# Patient Record
Sex: Female | Born: 1975 | Race: White | Hispanic: No | Marital: Married | Smoking: Current every day smoker
Health system: Southern US, Community
[De-identification: ages and names within clinical notes are randomized; demographics above are authoritative.]

## PROBLEM LIST (undated history)

## (undated) DIAGNOSIS — I495 Sick sinus syndrome: Secondary | ICD-10-CM

## (undated) DIAGNOSIS — F329 Major depressive disorder, single episode, unspecified: Secondary | ICD-10-CM

## (undated) DIAGNOSIS — T7840XA Allergy, unspecified, initial encounter: Secondary | ICD-10-CM

## (undated) DIAGNOSIS — I10 Essential (primary) hypertension: Secondary | ICD-10-CM

## (undated) DIAGNOSIS — F419 Anxiety disorder, unspecified: Secondary | ICD-10-CM

## (undated) DIAGNOSIS — G43909 Migraine, unspecified, not intractable, without status migrainosus: Secondary | ICD-10-CM

## (undated) DIAGNOSIS — A879 Viral meningitis, unspecified: Secondary | ICD-10-CM

## (undated) DIAGNOSIS — F32A Depression, unspecified: Secondary | ICD-10-CM

## (undated) HISTORY — DX: Viral meningitis, unspecified: A87.9

## (undated) HISTORY — DX: Major depressive disorder, single episode, unspecified: F32.9

## (undated) HISTORY — DX: Anxiety disorder, unspecified: F41.9

## (undated) HISTORY — DX: Migraine, unspecified, not intractable, without status migrainosus: G43.909

## (undated) HISTORY — DX: Depression, unspecified: F32.A

## (undated) HISTORY — DX: Allergy, unspecified, initial encounter: T78.40XA

---

## 2002-01-16 DIAGNOSIS — A879 Viral meningitis, unspecified: Secondary | ICD-10-CM

## 2002-01-16 HISTORY — DX: Viral meningitis, unspecified: A87.9

## 2003-11-27 ENCOUNTER — Ambulatory Visit (HOSPITAL_COMMUNITY): Admission: RE | Admit: 2003-11-27 | Discharge: 2003-11-27 | Payer: Self-pay | Admitting: Specialist

## 2003-12-02 ENCOUNTER — Encounter: Admission: RE | Admit: 2003-12-02 | Discharge: 2003-12-02 | Payer: Self-pay | Admitting: Specialist

## 2005-03-13 ENCOUNTER — Emergency Department: Payer: Self-pay | Admitting: Emergency Medicine

## 2007-02-23 ENCOUNTER — Ambulatory Visit: Payer: Self-pay | Admitting: Family Medicine

## 2008-07-23 ENCOUNTER — Ambulatory Visit: Payer: Self-pay | Admitting: Family Medicine

## 2010-05-09 ENCOUNTER — Ambulatory Visit: Payer: Self-pay | Admitting: Family Medicine

## 2010-10-19 ENCOUNTER — Ambulatory Visit: Payer: Self-pay | Admitting: Obstetrics and Gynecology

## 2011-09-08 ENCOUNTER — Ambulatory Visit: Payer: Self-pay

## 2011-09-08 LAB — URINALYSIS, COMPLETE

## 2011-09-10 LAB — URINE CULTURE

## 2013-02-27 ENCOUNTER — Ambulatory Visit: Payer: Self-pay

## 2013-02-27 LAB — RAPID STREP-A WITH REFLX: Micro Text Report: NEGATIVE

## 2013-03-02 LAB — BETA STREP CULTURE(ARMC)

## 2014-05-20 ENCOUNTER — Ambulatory Visit
Admission: RE | Admit: 2014-05-20 | Discharge: 2014-05-20 | Disposition: A | Discharge status: Home / Self Care | Payer: BC Managed Care – PPO | Source: Ambulatory Visit | Attending: Family Medicine | Admitting: Family Medicine

## 2014-05-20 ENCOUNTER — Other Ambulatory Visit: Payer: Self-pay | Admitting: Family Medicine

## 2014-05-20 DIAGNOSIS — R102 Pelvic and perineal pain: Secondary | ICD-10-CM | POA: Diagnosis not present

## 2014-05-20 DIAGNOSIS — R188 Other ascites: Secondary | ICD-10-CM | POA: Diagnosis not present

## 2014-05-22 ENCOUNTER — Other Ambulatory Visit: Payer: Self-pay | Admitting: Family Medicine

## 2014-05-22 DIAGNOSIS — N950 Postmenopausal bleeding: Secondary | ICD-10-CM

## 2014-10-15 ENCOUNTER — Other Ambulatory Visit: Payer: Self-pay | Admitting: Obstetrics and Gynecology

## 2014-10-15 DIAGNOSIS — Z1231 Encounter for screening mammogram for malignant neoplasm of breast: Secondary | ICD-10-CM

## 2014-10-22 ENCOUNTER — Ambulatory Visit
Admission: RE | Admit: 2014-10-22 | Discharge: 2014-10-22 | Disposition: A | Discharge status: Home / Self Care | Payer: BC Managed Care – PPO | Source: Ambulatory Visit | Attending: Obstetrics and Gynecology | Admitting: Obstetrics and Gynecology

## 2014-10-22 DIAGNOSIS — Z1231 Encounter for screening mammogram for malignant neoplasm of breast: Secondary | ICD-10-CM | POA: Diagnosis not present

## 2015-10-19 ENCOUNTER — Other Ambulatory Visit: Payer: Self-pay | Admitting: Obstetrics and Gynecology

## 2015-10-19 DIAGNOSIS — Z1231 Encounter for screening mammogram for malignant neoplasm of breast: Secondary | ICD-10-CM

## 2015-10-26 ENCOUNTER — Ambulatory Visit
Admission: RE | Admit: 2015-10-26 | Discharge: 2015-10-26 | Disposition: A | Discharge status: Home / Self Care | Payer: Managed Care, Other (non HMO) | Source: Ambulatory Visit | Attending: Obstetrics and Gynecology | Admitting: Obstetrics and Gynecology

## 2015-10-26 ENCOUNTER — Encounter: Payer: Self-pay | Admitting: Radiology

## 2015-10-26 DIAGNOSIS — Z1231 Encounter for screening mammogram for malignant neoplasm of breast: Secondary | ICD-10-CM | POA: Diagnosis not present

## 2016-11-07 ENCOUNTER — Other Ambulatory Visit: Payer: Self-pay | Admitting: Obstetrics and Gynecology

## 2016-11-07 DIAGNOSIS — Z1231 Encounter for screening mammogram for malignant neoplasm of breast: Secondary | ICD-10-CM

## 2016-11-27 ENCOUNTER — Ambulatory Visit
Admission: RE | Admit: 2016-11-27 | Discharge: 2016-11-27 | Disposition: A | Discharge status: Home / Self Care | Payer: 59 | Source: Ambulatory Visit | Attending: Obstetrics and Gynecology | Admitting: Obstetrics and Gynecology

## 2016-11-27 DIAGNOSIS — Z1231 Encounter for screening mammogram for malignant neoplasm of breast: Secondary | ICD-10-CM | POA: Insufficient documentation

## 2017-01-17 ENCOUNTER — Ambulatory Visit
Admission: RE | Admit: 2017-01-17 | Discharge: 2017-01-17 | Disposition: A | Discharge status: Home / Self Care | Payer: 59 | Source: Ambulatory Visit | Attending: Hematology and Oncology | Admitting: Hematology and Oncology

## 2017-01-17 ENCOUNTER — Other Ambulatory Visit: Payer: Self-pay

## 2017-01-17 ENCOUNTER — Inpatient Hospital Stay: Payer: 59

## 2017-01-17 ENCOUNTER — Inpatient Hospital Stay: Payer: 59 | Attending: Hematology and Oncology | Admitting: Hematology and Oncology

## 2017-01-17 ENCOUNTER — Encounter: Payer: Self-pay | Admitting: Hematology and Oncology

## 2017-01-17 VITALS — BP 119/77 | HR 71 | Temp 97.8°F | Resp 18 | Ht 68.0 in | Wt 171.5 lb

## 2017-01-17 DIAGNOSIS — Z79899 Other long term (current) drug therapy: Secondary | ICD-10-CM | POA: Diagnosis not present

## 2017-01-17 DIAGNOSIS — R490 Dysphonia: Secondary | ICD-10-CM

## 2017-01-17 DIAGNOSIS — E039 Hypothyroidism, unspecified: Secondary | ICD-10-CM | POA: Diagnosis not present

## 2017-01-17 DIAGNOSIS — R5383 Other fatigue: Secondary | ICD-10-CM | POA: Diagnosis not present

## 2017-01-17 DIAGNOSIS — Z803 Family history of malignant neoplasm of breast: Secondary | ICD-10-CM

## 2017-01-17 DIAGNOSIS — M549 Dorsalgia, unspecified: Secondary | ICD-10-CM

## 2017-01-17 DIAGNOSIS — M25559 Pain in unspecified hip: Secondary | ICD-10-CM

## 2017-01-17 DIAGNOSIS — R0982 Postnasal drip: Secondary | ICD-10-CM

## 2017-01-17 DIAGNOSIS — D72828 Other elevated white blood cell count: Secondary | ICD-10-CM | POA: Insufficient documentation

## 2017-01-17 DIAGNOSIS — F418 Other specified anxiety disorders: Secondary | ICD-10-CM | POA: Diagnosis not present

## 2017-01-17 DIAGNOSIS — R51 Headache: Secondary | ICD-10-CM

## 2017-01-17 DIAGNOSIS — R7989 Other specified abnormal findings of blood chemistry: Secondary | ICD-10-CM

## 2017-01-17 DIAGNOSIS — M25511 Pain in right shoulder: Secondary | ICD-10-CM | POA: Diagnosis not present

## 2017-01-17 DIAGNOSIS — M25512 Pain in left shoulder: Secondary | ICD-10-CM | POA: Diagnosis not present

## 2017-01-17 DIAGNOSIS — M25562 Pain in left knee: Secondary | ICD-10-CM | POA: Diagnosis not present

## 2017-01-17 DIAGNOSIS — M25561 Pain in right knee: Secondary | ICD-10-CM

## 2017-01-17 DIAGNOSIS — D72829 Elevated white blood cell count, unspecified: Secondary | ICD-10-CM

## 2017-01-17 DIAGNOSIS — F17210 Nicotine dependence, cigarettes, uncomplicated: Secondary | ICD-10-CM | POA: Diagnosis not present

## 2017-01-17 DIAGNOSIS — R0683 Snoring: Secondary | ICD-10-CM | POA: Diagnosis not present

## 2017-01-17 LAB — CBC WITH DIFFERENTIAL/PLATELET
Basophils Absolute: 0.1 10*3/uL (ref 0–0.1)
Basophils Relative: 1 %
Eosinophils Absolute: 0.2 10*3/uL (ref 0–0.7)
Eosinophils Relative: 2 %
HCT: 42.1 % (ref 35.0–47.0)
Hemoglobin: 14.4 g/dL (ref 12.0–16.0)
Lymphocytes Relative: 24 %
Lymphs Abs: 2.8 10*3/uL (ref 1.0–3.6)
MCH: 30.3 pg (ref 26.0–34.0)
MCHC: 34.1 g/dL (ref 32.0–36.0)
MCV: 88.9 fL (ref 80.0–100.0)
Monocytes Absolute: 0.8 10*3/uL (ref 0.2–0.9)
Monocytes Relative: 7 %
Neutro Abs: 7.9 10*3/uL — ABNORMAL HIGH (ref 1.4–6.5)
Neutrophils Relative %: 66 %
Platelets: 315 10*3/uL (ref 150–440)
RBC: 4.74 MIL/uL (ref 3.80–5.20)
RDW: 13.6 % (ref 11.5–14.5)
WBC: 11.8 10*3/uL — ABNORMAL HIGH (ref 3.6–11.0)

## 2017-01-17 LAB — T4, FREE: Free T4: 0.53 ng/dL — ABNORMAL LOW (ref 0.61–1.12)

## 2017-01-17 NOTE — Progress Notes (Signed)
Patient here today regarding elevated WBC.  Referred by Dr. Merlinda FrederickMcLaughlin.  Patient states she gets 8 hours sleep a night and continues to be fatigued all the time.

## 2017-01-17 NOTE — Progress Notes (Signed)
[facility]-  Cancer Center  Clinic day:  01/17/2017  Chief Complaint: Connie Oneal is a 42 y.o. female with an elevated WBC who is referred in consultation by Dr. Maurine MinisterMiriam McLaughlin for assessment and management.  HPI: The patient first became aware of an elevated WBC after her regular GYN appointment in 10/2016.  She was feeling well.  She was told that her white blood cell count was mildly elevated.  CBCs are available since 11/12/2013.  CBC on 10/22/2015 was normal.  CBC on 11/07/2016 revealed a hematocrit of 40.5, hemoglobin 13.5, MCV 92.7, platelets 301,0000, WBC 12,200 with an ANC of 7660.  Differential included 63% segs, 28% lymphs, 7% monocytes, and 2% eosinophils.  CBC on 12/27/2016 revealed a hematocrit of 42.1, hemoglobin 14.0, MCV 92.5, platelets 354,000, WBC 15,700 with an ANC of 11,500.  Differential included 73% segs, 20% lymphs, 5% monocytes, and 1% eosinophils.  CMP was normal on 12/27/2016.  Sed rate was 8 on 12/27/2016.  RA latex turbid was negative on 12/28/2016.  B12 was 468 on 11/07/2016.  TSH was 5.744 (high) on 12/27/2016.  She has been extremely fatigued x 2 months.  She is sleeping well.  She denies sleep apnea but notes "I snore like I was cutting down a forest".    She notes a 2 month history of joint pain (shoulders).  She describes hip and knee discomfort and feeling "like the left leg bone pops out of place".  She describes her back bothering her (old injury 10 years ago).  She states that yesterday, she had difficulty lifting her left arm.  She notes a deep ache .  She denies any B symptoms.  She denies any cough or shortness of breath.  She denies any urinary symptoms.  She notes a history of blood in her urine in 12/2016.  She denies any bone pain.  She denies any skin infections or after bath itching.    She notes hoarseness and a deeper voice x 2 years.  She denies any post nasal drip or chronic congestion.  She notes that her hoarseness  worsens when she has post nasal drip.  She has smoked 1/2 pack per day x 25 years.   Past Medical History:  Diagnosis Date  . Allergy   . Anxiety   . Depression   . Migraines   . Viral meningitis 2004    No past surgical history on file.  Family History  Problem Relation Age of Onset  . Breast cancer Paternal Aunt [id number]    Social History:  reports that she has been smoking cigarettes.  She has a 12.50 pack-year smoking history. She does not have any smokeless tobacco history on file. She reports that she drinks alcohol. Her drug history is not on file.  She has smokeed 10 cigarettes a day x 25 years.  She drinks alcohol on the weekend.  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  The patient is alone today.  Allergies:  Allergies  Allergen Reactions  . Codeine Other (See Comments)    Other Reaction: Not Assessed    Current Medications: Current Outpatient Medications  Medication Sig Dispense Refill  . fexofenadine (ALLEGRA) 180 MG tablet Take 180 mg by mouth daily.    . hydrochlorothiazide (HYDRODIURIL) 25 MG tablet Take 25 mg by mouth daily.    Marland Kitchen. PARoxetine (PAXIL) 20 MG tablet Take 20 mg by mouth daily.     No current facility-administered medications for this visit.  Review of Systems:  GENERAL:  Fatigue x 2 months.  No fevers, sweats or weight loss. PERFORMANCE STATUS (ECOG):  0 HEENT:  Hoarse x 2 years.  No visual changes, runny nose, sore throat, mouth sores or tenderness. Lungs: No shortness of breath or cough.  No hemoptysis. Cardiac:  No chest pain, palpitations, orthopnea, or PND. GI:  No nausea, vomiting, diarrhea, constipation, melena or hematochezia. GU:  No urgency, frequency, dysuria.  Hematuria in 12/2016. Musculoskeletal:  Old back pain.  Shoulder pain.  Hip and knee pain.  Ache in left arm yesterday. Extremities:  No pain or swelling. Skin:  No rashes or skin changes. Neuro:  Intermittent headache.  No numbness or weakness, balance or coordination  issues. Endocrine:  No diabetes, thyroid issues, hot flashes or night sweats. Psych:  No mood changes, depression or anxiety. Pain:  No focal pain. Review of systems:  All other systems reviewed and found to be negative.  Physical Exam: Blood pressure 119/77, pulse 71, temperature 97.8 F (36.6 C), temperature source Tympanic, resp. rate 18, height 5\' 8"  (1.727 m), weight 171 lb 8.3 oz (77.8 kg). GENERAL:  Well developed, well nourished, woman sitting comfortably in the exam room in no acute distress. MENTAL STATUS:  Alert and oriented to person, place and time. HEAD:  Long blonde hair.  Normocephalic, atraumatic, face symmetric, no Cushingoid features. EYES:  Blue eyes.  Pupils equal round and reactive to light and accomodation.  No conjunctivitis or scleral icterus. ENT:  Hoarse.  Oropharynx clear without lesion.  Tongue normal. Mucous membranes moist.  RESPIRATORY:  Clear to auscultation without rales, wheezes or rhonchi. CARDIOVASCULAR:  Regular rate and rhythm without murmur, rub or gallop. ABDOMEN:  Soft, non-tender, with active bowel sounds, and no hepatosplenomegaly.  No masses. SKIN:  No rashes, ulcers or lesions. EXTREMITIES: No edema, no skin discoloration or tenderness.  No palpable cords. LYMPH NODES: No palpable cervical, supraclavicular, axillary or inguinal adenopathy  NEUROLOGICAL: Unremarkable. PSYCH:  Appropriate.   Office Visit on 01/17/2017  Component Date Value Ref Range Status  . WBC 01/17/2017 11.8* 3.6 - 11.0 K/uL Final  . RBC 01/17/2017 4.74  3.80 - 5.20 MIL/uL Final  . Hemoglobin 01/17/2017 14.4  12.0 - 16.0 g/dL Final  . HCT 16/10/9602 42.1  35.0 - 47.0 % Final  . MCV 01/17/2017 88.9  80.0 - 100.0 fL Final  . MCH 01/17/2017 30.3  26.0 - 34.0 pg Final  . MCHC 01/17/2017 34.1  32.0 - 36.0 g/dL Final  . RDW 54/09/8117 13.6  11.5 - 14.5 % Final  . Platelets 01/17/2017 315  150 - 440 K/uL Final  . Neutrophils Relative % 01/17/2017 66  % Final  . Neutro Abs  01/17/2017 7.9* 1.4 - 6.5 K/uL Final  . Lymphocytes Relative 01/17/2017 24  % Final  . Lymphs Abs 01/17/2017 2.8  1.0 - 3.6 K/uL Final  . Monocytes Relative 01/17/2017 7  % Final  . Monocytes Absolute 01/17/2017 0.8  0.2 - 0.9 K/uL Final  . Eosinophils Relative 01/17/2017 2  % Final  . Eosinophils Absolute 01/17/2017 0.2  0 - 0.7 K/uL Final  . Basophils Relative 01/17/2017 1  % Final  . Basophils Absolute 01/17/2017 0.1  0 - 0.1 K/uL Final   Performed at [facility], [address], [place], [place] [address]  . CRP 01/17/2017 0.9  <1.0 mg/dL Final   Performed at [facility], [address] [place], [place] [address]  . Anit Nuclear Antibody(ANA)  01/17/2017 Negative  Negative Final   Comment: (NOTE) Performed At: [facility] [address] [place], [place] [id number] Jolene Schimke MD [id number] Performed at [facility], [address] [place], [place] [address]   . Free T4 01/17/2017 0.53* 0.61 - 1.12 ng/dL Final   Comment: (NOTE) Biotin ingestion may interfere with free T4 tests. If the results are inconsistent with the TSH level, previous test results, or the clinical presentation, then consider biotin interference. If needed, order repeat testing after stopping biotin. Performed at [facility], [address] [place], [place] [address]   . Path Review 01/17/2017    Final                   Value:Blood smear reviewed. Platelets are not increased. RBCs are unremarkable. There is mild neutrophilic leukocytosis. No increase in eosinophils or basophils is present, and no immature granulocytes are identified. There are no features to suggest a  myeloproliferative neoplasm.    Comment: Reviewed by Elnita Maxwell. Forde Dandy, MD. Performed at [facility], [address], [place], [place] [address]     Assessment:  Connie Oneal is a 42 y.o. female with mild leukocytosis since 10/2016.  WBC has ranged between 12,200 -  15,700.  Differential is unremarkable.  Etiology is likely reactive and due to smoking.  CBC on 12/27/2016 revealed a hematocrit of 42.1, hemoglobin 14.0, MCV 92.5, platelets 354,000, WBC 15,700 with an ANC of 11,500.  Differential included 73% segs, 20% lymphs, 5% monocytes, and 1% eosinophils.  CMP, sed rate, and RA were normal on 12/27/2016.  TSH was 5.744 (high) on 12/27/2016.  She has been fatigued x 2 months.  She denies sleep apnea.  She has joint pain.  She has been hoarse x 2 years.  Exam reveals no adenopathy or hepatosplenomegaly.  Plan: 1.  Labs today:  CBC with diff, CRP, ANA, free T4. 2.  Smear for path review. 3.  Discuss mild leukocytosis, predominantly neutrophils.  Suspect etiology is reactive and likely related to smoking and/or joint pain. 4.  Discuss elevated TSH.  Check free T4. 5.  Discuss chronic hoarseness and referral to ENT.   6.  CXR (PA and lateral). 7.  Consult ENT- hoarseness x 2 years. 8.  RTC after ENT consult for MD assessment and review of work-up.   Rosey Bath, MD  01/17/2017, 3:35 PM

## 2017-01-18 ENCOUNTER — Telehealth: Payer: Self-pay | Admitting: *Deleted

## 2017-01-18 LAB — PATHOLOGIST SMEAR REVIEW

## 2017-01-18 LAB — ANA W/REFLEX: Anti Nuclear Antibody(ANA): NEGATIVE

## 2017-01-18 LAB — C-REACTIVE PROTEIN: CRP: 0.9 mg/dL (ref <1.0)

## 2017-01-18 NOTE — Telephone Encounter (Signed)
-----   Message from Rosey BathMelissa C Corcoran, MD sent at 01/18/2017  6:18 AM EST ----- Regarding: Please notify patient of low free T4 and send to PCP   ----- Message ----- From: Interface, Lab In [place] Sent: 01/17/2017   3:39 PM To: Rosey BathMelissa C Corcoran, MD

## 2017-01-18 NOTE — Telephone Encounter (Signed)
Called patient and LVM that T-4 level yesterday was low.  We have forwarded her results to her PCP who will manage her care regarding this.  Explained this has to do with her thyroid and could explain some of her fatigue.  She should contact her PCP if she does not hear from them in as couple of days.

## 2017-01-22 ENCOUNTER — Encounter: Payer: Self-pay | Admitting: Hematology and Oncology

## 2017-02-07 ENCOUNTER — Encounter: Payer: Self-pay | Admitting: Hematology and Oncology

## 2017-02-07 ENCOUNTER — Inpatient Hospital Stay (HOSPITAL_BASED_OUTPATIENT_CLINIC_OR_DEPARTMENT_OTHER): Payer: 59 | Admitting: Hematology and Oncology

## 2017-02-07 VITALS — BP 142/2 | HR 72 | Temp 98.4°F | Resp 18 | Wt 173.1 lb

## 2017-02-07 DIAGNOSIS — D72829 Elevated white blood cell count, unspecified: Secondary | ICD-10-CM

## 2017-02-07 DIAGNOSIS — R5383 Other fatigue: Secondary | ICD-10-CM

## 2017-02-07 DIAGNOSIS — E039 Hypothyroidism, unspecified: Secondary | ICD-10-CM | POA: Diagnosis not present

## 2017-02-07 DIAGNOSIS — R490 Dysphonia: Secondary | ICD-10-CM

## 2017-02-07 DIAGNOSIS — D72828 Other elevated white blood cell count: Secondary | ICD-10-CM | POA: Diagnosis not present

## 2017-02-07 DIAGNOSIS — F17210 Nicotine dependence, cigarettes, uncomplicated: Secondary | ICD-10-CM | POA: Diagnosis not present

## 2017-02-07 NOTE — Progress Notes (Signed)
Patient saw Dr. Irving ShowsJuengal on Monday.  Patient states she has callouses on her vocal cords.  He has restricted her talking.  She also has irritation from acid reflux but not enough to worry about.

## 2017-02-07 NOTE — Progress Notes (Signed)
[facility]-  Cancer Center  Clinic day:  02/07/2017   Chief Complaint: Connie FeltyCynthia P Oneal is a 42 y.o. female with an elevated WBC who is seen for review of work-up and discussion regarding direction of therapy.  HPI:  The patient was last seen in the medical oncology clinic on 01/17/2017 for initial consultation.   She described fatigue x 2 months.  She had mild leukocytosis since 10/2016.  WBC had ranged between 12,200 - 15,700.  Differential was unremarkable.  Etiology was felt likely reactive and due to smoking.  Outside labs revealed an elevated TSH.  She had been hoarse x 2 years. She was referred to ENT.  Labs on 01/17/2017 revealed a hematocrit of 42.1, hemoglobin 14.4, MCV 88.9, platelets 315,000, WBC 11,800 with an ANC of 7900.  CRP was 0.9.  ANA was negative.  Free T4 was 0.53 (low) c/w primary hypothyroidism.  CXR revealed no active cardiopulmonary disease.  Peripheral smear revealed platelets were not increased. RBCs were unremarkable. There was mild neutrophilic leukocytosis. There was no increase in eosinophils or basophils, and no immature granulocytes.  There were no features to suggest a myeloproliferative neoplasm.   She was seen by Dr. Elenore RotaJuengel on 02/05/2017.  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  Vocal hygiene was discussed.  She has laryngopharyngeal reflux.  Omeprazole was prescribed.  Smoking cessation was discussed.  She has follow-up in 1 month.   During the interim, she has done well overall. She notes that she was told by Dr. Elenore RotaJuengel to "not talk for a month". 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 She is utilizing a "text to speech" app on her phone to assist with daily instruction of her students. Patient states, "I haven't spoken all day. This is the most that I have talked. He told me that I have calluses on my vocal cords, and if I don't talk then they will go away".  Her voice remains hoarse.  She  continues to experience fatigue. She notes that she does well during the day, however in the late afternoon and early evening, her symptoms really worsen. Patient states, "if I sit down then it is all over. It takes everything that I have to get back up."   She is eating well. Her weight remains stable. Patient denies pain in the clinic today.   Past Medical History:  Diagnosis Date  . Allergy   . Anxiety   . Depression   . Migraines   . Viral meningitis 2004    No past surgical history on file.  Family History  Problem Relation Age of Onset  . Breast cancer Paternal Aunt 6960    Social History:  reports that she has been smoking cigarettes.  She has a 12.50 pack-year smoking history. she has never used smokeless tobacco. She reports that she drinks alcohol. Her drug history is not on file.  She has smokeed 10 cigarettes a day x 25 years.  She drinks alcohol on the weekend.  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  The patient is alone today.  Allergies:  Allergies  Allergen Reactions  . Codeine Other (See Comments)    Other Reaction: Not Assessed    Current Medications: Current Outpatient Medications  Medication Sig Dispense Refill  . fexofenadine (ALLEGRA) 180 MG tablet Take 180 mg by mouth daily.    . hydrochlorothiazide (HYDRODIURIL) 25 MG tablet Take 25 mg by mouth daily.    .Marland Kitchen  PARoxetine (PAXIL) 20 MG tablet Take 20 mg by mouth daily.     No current facility-administered medications for this visit.     Review of Systems:  GENERAL:  Fatigue x 2 months.  No fevers or sweats. Weight up 2 pounds. PERFORMANCE STATUS (ECOG):  0 HEENT:  Hoarse x 2 years (see HPI).  No visual changes, runny nose, sore throat, mouth sores or tenderness. Lungs: No shortness of breath or cough.  No hemoptysis. Cardiac:  No chest pain, palpitations, orthopnea, or PND. GI:  No nausea, vomiting, diarrhea, constipation, melena or hematochezia. GU:  No urgency, frequency, dysuria.  Hematuria in  12/2016. Musculoskeletal:  Old back pain.  Shoulder pain.  Hip and knee pain.  Ache in left arm yesterday. Extremities:  No pain or swelling. Skin:  No rashes or skin changes. Neuro:  Intermittent headache.  No numbness or weakness, balance or coordination issues. Endocrine:  No diabetes, thyroid issues, hot flashes or night sweats. Psych:  No mood changes, depression or anxiety. Pain:  No focal pain. Review of systems:  All other systems reviewed and found to be negative.  Physical Exam: Blood pressure (!) 142/2, pulse 72, temperature 98.4 F (36.9 C), temperature source Tympanic, resp. rate 18, weight 173 lb 1 oz (78.5 kg). GENERAL:  Well developed, well nourished, woman sitting comfortably in the exam room in no acute distress. MENTAL STATUS:  Alert and oriented to person, place and time. HEAD:  Long blonde hair.  Normocephalic, atraumatic, face symmetric, no Cushingoid features. EYES:  Blue eyes.  No conjunctivitis or scleral icterus. ENT:  Hoarse.  Soft speech. NEUROLOGICAL: Unremarkable. PSYCH:  Appropriate.   No visits with results within 3 Day(s) from this visit.  Latest known visit with results is:  Office Visit on 01/17/2017  Component Date Value Ref Range Status  . WBC 01/17/2017 11.8* 3.6 - 11.0 K/uL Final  . RBC 01/17/2017 4.74  3.80 - 5.20 MIL/uL Final  . Hemoglobin 01/17/2017 14.4  12.0 - 16.0 g/dL Final  . HCT 40/98/1191 42.1  35.0 - 47.0 % Final  . MCV 01/17/2017 88.9  80.0 - 100.0 fL Final  . MCH 01/17/2017 30.3  26.0 - 34.0 pg Final  . MCHC 01/17/2017 34.1  32.0 - 36.0 g/dL Final  . RDW 47/82/9562 13.6  11.5 - 14.5 % Final  . Platelets 01/17/2017 315  150 - 440 K/uL Final  . Neutrophils Relative % 01/17/2017 66  % Final  . Neutro Abs 01/17/2017 7.9* 1.4 - 6.5 K/uL Final  . Lymphocytes Relative 01/17/2017 24  % Final  . Lymphs Abs 01/17/2017 2.8  1.0 - 3.6 K/uL Final  . Monocytes Relative 01/17/2017 7  % Final  . Monocytes Absolute 01/17/2017 0.8  0.2 - 0.9  K/uL Final  . Eosinophils Relative 01/17/2017 2  % Final  . Eosinophils Absolute 01/17/2017 0.2  0 - 0.7 K/uL Final  . Basophils Relative 01/17/2017 1  % Final  . Basophils Absolute 01/17/2017 0.1  0 - 0.1 K/uL Final   Performed at [facility], [address], [place], [place] [address]  . CRP 01/17/2017 0.9  <1.0 mg/dL Final   Performed at [facility], [address], [place], [place] [address]  . Anit Nuclear Antibody(ANA) 01/17/2017 Negative  Negative Final   Comment: (NOTE) Performed At: [facility] [address] [place], [place] [id number] Jolene Schimke MD [id number] Performed at [facility], [address], [place], [place] [address]   .  Free T4 01/17/2017 0.53* 0.61 - 1.12 ng/dL Final   Comment: (NOTE) Biotin ingestion may interfere with free T4 tests. If the results are inconsistent with the TSH level, previous test results, or the clinical presentation, then consider biotin interference. If needed, order repeat testing after stopping biotin. Performed at [facility], [address] [place], [place] [address]   . Path Review 01/17/2017    Final                   Value:Blood smear reviewed. Platelets are not increased. RBCs are unremarkable. There is mild neutrophilic leukocytosis. No increase in eosinophils or basophils is present, and no immature granulocytes are identified. There are no features to suggest a  myeloproliferative neoplasm.    Comment: Reviewed by Elnita Maxwell. Forde Dandy, MD. Performed at [facility], [address] [place] [place], [place] [address]     Assessment:  FLOREE ZUNIGA is a 42 y.o. female with mild reactive leukocytosis since 10/2016 felt likely secondary to smoking.  WBC has ranged between 12,200 - 15,700.  Differential is unremarkable.   CBC on 12/27/2016 revealed a hematocrit of 42.1, hemoglobin 14.0, MCV 92.5, platelets 354,000, WBC 15,700 with an ANC of 11,500.  Differential included  73% segs, 20% lymphs, 5% monocytes, and 1% eosinophils.  CMP, sed rate, and RA were normal on 12/27/2016.  Work-up on 01/17/2017 revealed a WBC 11,800 with an ANC of 7900.  CRP was 0.9.  ANA was negative.  Peripheral smear was unremarkable. CXR revealed no active cardiopulmonary disease.  She has primary hypothyroidism.  TSH was 5.744 (high) on 12/27/2016.  Free T4 was 0.53 (low) c/w primary hypothyroidism.  Symptomatically, she remains fatigued.  Her labs are are consistent with HYPOthyroidism.  Results have been sent to her PCP.  She has a hoarse voice.  She is participating in recommended vocal cord hygiene per ENT.  Exam is stable.   Plan: 1.  Review work-up (labs and CXR).  Mild leukocytosis appears reactive.  Peripheral smear unremarkable.  Anticipate WBC will decrease with smoking cessation and decreased inflammation.  Discuss follow-up as needed or if any concern regarding increasing WBC. 2.  Discuss hypothyroidism. Last TSH 5.744 with a free T4 of 0.53.  Will send results to Dr. Merlinda Frederick for follow up.  3.  Follow up with Dr. Elenore Rota for continuing care and treatment. Scheduled for follow up in 1 month. 4.  RTC PRN.    Quentin Mulling, NP 02/07/2017, 4:17 PM  I saw and evaluated the patient, participating in the key portions of the service and reviewing pertinent diagnostic studies and records.  I reviewed the nurse practitioner's note and agree with the findings and the plan.  The assessment and plan were discussed with the patient.  A few questions were asked by the patient and answered.   Nelva Nay, MD 02/07/2017,4:17 PM

## 2017-11-06 ENCOUNTER — Other Ambulatory Visit: Payer: Self-pay | Admitting: Obstetrics and Gynecology

## 2017-11-06 DIAGNOSIS — Z1231 Encounter for screening mammogram for malignant neoplasm of breast: Secondary | ICD-10-CM

## 2017-11-28 ENCOUNTER — Ambulatory Visit
Admission: RE | Admit: 2017-11-28 | Discharge: 2017-11-28 | Disposition: A | Discharge status: Home / Self Care | Payer: 59 | Source: Ambulatory Visit | Attending: Obstetrics and Gynecology | Admitting: Obstetrics and Gynecology

## 2017-11-28 ENCOUNTER — Encounter (INDEPENDENT_AMBULATORY_CARE_PROVIDER_SITE_OTHER): Payer: Self-pay

## 2017-11-28 DIAGNOSIS — Z1231 Encounter for screening mammogram for malignant neoplasm of breast: Secondary | ICD-10-CM

## 2018-02-05 ENCOUNTER — Encounter: Payer: Self-pay | Admitting: Physical Therapy

## 2018-02-05 ENCOUNTER — Ambulatory Visit: Payer: 59 | Attending: Family Medicine | Admitting: Physical Therapy

## 2018-02-05 ENCOUNTER — Other Ambulatory Visit: Payer: Self-pay

## 2018-02-05 DIAGNOSIS — R42 Dizziness and giddiness: Secondary | ICD-10-CM | POA: Diagnosis present

## 2018-02-05 NOTE — Therapy (Signed)
[place] [facility] MAIN [facility] SERVICES [address] [place], [place], [address] Phone: [phone]   Fax:  [phone]  Physical Therapy Evaluation  Patient Details  Name: Connie Oneal [facility] MRN: [id number] Date of Birth: 1975/02/24 No data recorded  Encounter Date: 02/05/2018  PT End of Session - 02/13/18 1125    Visit Number  1    Number of Visits  9    Date for PT Re-Evaluation  04/02/18    PT Start Time  0814    PT Stop Time  0920    PT Time Calculation (min)  66 min    Equipment Utilized During Treatment  Gait belt    Activity Tolerance  Patient tolerated treatment well    Behavior During Therapy  [facility] for tasks assessed/performed       Past Medical History:  Diagnosis Date  . Allergy   . Anxiety   . Depression   . Migraines   . Viral meningitis 2004    History reviewed. No pertinent surgical history.  There were no vitals filed for this visit.   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     Pertinent History  Patient reports she has had vertigo episodes in the past several years. 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 Patient reports she would get a bout of dizziness once a year. Patient reports her most recent significant episode was in August and patient states this episode "lasted 3 solid weeks". Patient denies having had any upper respiratory infections in August.Patient reports she has had 4 additional intermittent episodes of vertigo since August. Patient reports she did not see a physician for the episode in August. Patient reports she has not seen an ENT in regards to her vertigo symptoms. Patient reports she began to have dizziness again in January 2020 and went to see her primary care physician. Patient reported that she tilted her head upside down to try to dry her hair and  when she stood back up she began having dizziness and vertigo. Patient reports she was prescribed Meclizine and that she took it every day for a week when it was first prescribed. Patient states the last dose she took was this last Friday. Patient reports oscillopsia, nausea and sometimes vomiting, severe. Patient reports her dizziness "is worse in the bed and that is usually when it gets me and wakes me up from a dead sleep". Patient reports that currently she is getting at least one episode of dizziness each day and that the dizziness last seconds. Patient reports quick turns, turning her head up, head turns bring on her symptoms. Patient reports that she has slowed down her movements to try to avoid bringing on her symptoms. Patient reports she had viral menigitis about 15 years ago in 09/2002 and she started having migraines after that time. Patient reports she does not get migraines often, maybe two or three a year. Patient reports she went to [facility] in [place] for a little while. She was on Emitrex which she took daily for 4-5 years. Patient reports she has not been on Emitrex now in about 10 years. Patient reports she experienced a migraine and dizziness/vertigo in January 2020 but reports this is the only time that she has had a migraine occur at the same time as the  vertigo.     Currently in Pain?  No/denies          [facility] Assessment - 02/13/18 1057      Assessment   Medical Diagnosis  vertigo/ BPPV    Referring Provider (PT)  Debbra RidingJason Whitaker, PA-C    Onset Date/Surgical Date  08/16/17    Prior Therapy  no prior vestibular therapy      Restrictions   Weight Bearing Restrictions  No      Balance Screen   Has the patient fallen in the past 6 months  No    Has the patient had a decrease in activity level because of a fear of falling?   Yes    Is the patient reluctant to leave their home because of a fear of falling?   No      Home Environment   Living Environment   Private residence    Living Arrangements  Spouse/significant other    Available Help at Discharge  Family;Friend(s)    Home Access  Stairs to enter    Entrance Stairs-Number of Steps  4    Home Layout  One level    Home Equipment  None      Prior Function   Level of Independence  Independent;Independent with community mobility without device    Vocation  Full time employment   teaching assistant     Standardized Balance Assessment   Standardized Balance Assessment  Dynamic Gait Index      Dynamic Gait Index   Level Surface  Normal    Change in Gait Speed  Normal    Gait with Horizontal Head Turns  Normal    Gait with Vertical Head Turns  Moderate Impairment    Gait and Pivot Turn  Normal    Step Over Obstacle  Normal    Step Around Obstacles  Normal    Steps  Mild Impairment    Total Score  21         VESTIBULAR AND BALANCE EVALUATION  HISTORY:  Subjective history of current problem: Patient reports she has had vertigo episodes in the past several years. 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 Patient reports she would get a bout of dizziness once a year. Patient reports her most recent significant episode was in August and patient states this episode "lasted 3 solid weeks". Patient denies having had any upper respiratory infections in August.Patient reports she has had 4 additional intermittent episodes of vertigo since August. Patient reports she did not see a physician for the episode in August. Patient reports she has not seen an ENT in regards to her vertigo symptoms. Patient reports she began to have dizziness again in January 2020 and went to see her primary care physician. Patient reported that she tilted her head upside down to try to dry her hair and when she stood back up she began having dizziness and vertigo. Patient reports she was prescribed Meclizine and that she took it every day for a week when it was first prescribed. Patient states the last dose she took  was this last Friday. Patient reports oscillopsia, nausea and sometimes vomiting, severe. Patient reports her dizziness "is worse in the bed and that is usually when it gets me and wakes me up from a dead sleep". Patient reports that currently she is getting at least one episode of dizziness each day and that the dizziness last seconds. Patient reports quick turns, turning her head up, head turns  bring on her symptoms. Patient reports that she has slowed down her movements to try to avoid bringing on her symptoms. Patient reports she had viral menigitis about 15 years ago in 09/2002 and she started having migraines after that time. Patient reports she does not get migraines often, maybe two or three a year. Patient reports she went to [facility] in [place] for a little while. She was on Emitrex which she took daily for 4-5 years. Patient reports she has not been on Emitrex now in about 10 years. Patient reports she experienced a migraine and dizziness/vertigo in January 2020 but reports this is the only time that she has had a migraine occur at the same time as the vertigo.   Description of dizziness: (vertigo, unsteadiness, lightheadedness, falling, general unsteadiness, whoozy, swimmy-headed sensation, aural fullness) vertigo Frequency: patient reports she has a dizzy spell at least once a day Duration: lasts 10 seconds and has had episodes that last 30-40 seconds Symptom nature: motion provoked, positional, intermittent  Provocative Factors: spin around quickly, looking straight up, turning head, reports she moves slowly because it will bring on her symptoms.  Easing Factors: meclizine, rest  Progression of symptoms: worse History of similar episodes: yes  Falls (yes/no): no Number of falls in past 6 months: 0  Prior Functional Level: patient is independent community ambulator without AD. Patient works as an Tourist information centre manager.   Auditory complaints (tinnitus, pain,  drainage): denies Vision (last eye exam, diplopia, recent changes): glasses, patient reports that her husband has observed nystagmus, denies other vision problems  Current Symptoms: (dysarthria, dysphagia, drop attacks, bowel and bladder changes, recent weight loss/gain)    Review of systems negative for red flags except for some weight gain and she was diagnosed with hypothyroid. she will switch the beginning sounds of words she has noticed when she says two words.     EXAMINATION      COORDINATION: Finger to Nose:  Normal Past Pointing:   Normal bilaterally  MUSCULOSKELETAL SCREEN: Cervical Spine ROM: Cervical AROM WNL without discomfort   Functional Mobility: Patient independent with transfers and bed mobility skills.  Gait: Patient arrives ambulating without AD. Patient ambulates with fair cadence with step through gait pattern. Scanning of visual environment with gait is: fair  Balance: Patient is challenged by ambulation with head turns especially vertical head turns as well as uneven surfaces and narrow BOS.   OCULOMOTOR / VESTIBULAR TESTING: Oculomotor Exam- Room Light  Normal Abnormal Comments  Ocular Alignment N    Ocular ROM N    Spontaneous Nystagmus N    Gaze evoked Nystagmus N    Smooth Pursuit N    Saccades  Abn Very mild hypometric saccades noted  VOR  Abn Reports mild dizziness and some blurring of target  VOR Cancellation N    Left Head Impulse N    Right Head Impulse  Abn Noted several corrective saccades  Static Acuity N    Dynamic Acuity  Abn 4 line loss with DVA testing    BPPV TESTS:  Symptoms Duration Intensity Nystagmus  Left Dix-Hallpike None   None observed  Right Dix-Hallpike None   2nd time mild focusing issue no vertigo or nystagmus  Left Head Roll None   None observed  Right Head Roll None   1-2 seconds of vertigo; no nystagmus observed    FUNCTIONAL OUTCOME MEASURES:  Results Comments  DHI  54/100 Moderate perception of handicap; in  need of intervention  ABC Scale 64.3 %  Falls risk; in need of intervention  DGI 21 /24 Mild impairment; in need of intervention    Neuromuscular Re-education:  VOR X 1 exercise:  Demonstrated and educated as to VOR X1.  Patient performed VOR X 1 horizontal in sitting 3 reps of 1 minute each with verbal cues for technique.  Patient reports 4/10 dizziness after stopped movement with VOR.  Issued for HEP.   Feet together exercise: On firm surface and then standing on a pillow, patient performed feet together with and without horizontal and vertical head turns.  Performed multiple 1 minute reps of each.  Issued for HEP.   PT Education - 02/06/18 1553    Education Details  discussed plan of care and issued feet together on uneven surface with horizontal and vertical head turns and VOR 1 minute in sitting for HEP    Person(s) Educated  Patient    Methods  Explanation;Handout;Demonstration;Verbal cues    Comprehension  Verbalized understanding;Returned demonstration       PT Short Term Goals - 02/06/18 1553      PT SHORT TERM GOAL #1   Title  Pt will be independent with HEP in order to improve balance and decrease dizziness symptoms in order to improve function at home and work.    Time  4    Period  Weeks    Status  New    Target Date  03/05/18        PT Long Term Goals - 02/06/18 1553      PT LONG TERM GOAL #1   Title  Patient will report 50% or greater improvement in her symptoms of dizziness and imbalance with provoking motions or positions.    Time  8    Period  Weeks    Status  New    Target Date  04/02/18      PT LONG TERM GOAL #2   Title  Patient will reduce perceived disability to low levels as indicated by <40 on Dizziness Handicap Inventory to demonstrate significant improvement in dizziness.    Baseline  54/100 on 02/05/18    Time  8    Period  Weeks    Status  New    Target Date  04/02/18      PT LONG TERM GOAL #3   Title  Patient will demonstrate  decreased falls risk as indicated by Activities Specific Balance Confidence Scale score of 80% or greater.    Baseline  scored 64.3% on 02/05/18    Time  8    Period  Weeks    Status  New    Target Date  04/02/18             Plan - 02/13/18 1137    Clinical Impression Statement  Patient reports intermittent history of episodes of vertigo and dizziness for many years. Patient had an episode of vertigo in August 2019 that lasted for several weeks and most recently had an episode in January 2020. Patient found to have abnormal saccades, VOR, right head impulse test and 4 line loss with DVA testing which could be indicative of peripheral vestibular issue. Patient with negative Dix-Hallpike testing this date. Will plan on re-testing and will consider deep head hang test next session to further assess for BPPV as patient is reporting daily episodes of vertigo lasting seconds brought on by changes in head position. Will treat with canalith repositioning manuevers if indicated. Patient scored moderate perception of handicap on the [facility] and scored 64% on the  ABC scale indicating patient is at risk for falls. Patient would benefit from PT services to address functional deficits and goals as set on plan of care.     History and Personal Factors relevant to plan of care:  comorbidities: migraines, viral meningitis, anxiety; time since onset of exacerbation    Clinical Presentation  Evolving    Clinical Decision Making  Moderate    Rehab Potential  Good    PT Frequency  1x / week    PT Duration  8 weeks    PT Treatment/Interventions  Canalith Repostioning;Vestibular;Gait training;Stair training;Therapeutic activities;Therapeutic exercise;Balance training;Neuromuscular re-education;Patient/family education    PT Next Visit Plan  Review HEP; try deep head hang test for BPPV; progressions of activities with head turns    PT Home Exercise Plan  VOR X 1 in sitting, FT on pillow with horizontal and vertical head  turns    Consulted and Agree with Plan of Care  Patient       Patient will benefit from skilled therapeutic intervention in order to improve the following deficits and impairments:  Dizziness  Visit Diagnosis: Dizziness and giddiness     Problem List Patient Active Problem List   Diagnosis Date Noted  . Hypothyroidism 02/07/2017  . Hoarseness 02/07/2017  . Leukocytosis 01/17/2017   Mardelle Matte PT, DPT [phone] Mardelle Matte 02/05/2018, 8:20 AM  [facility] MAIN [facility] [address] [place], [place], [address] Phone: [phone]   Fax:  [phone]  Name: DAYJA LOVERIDGE MRN: [id number] Date of Birth: 16-Aug-1975

## 2018-02-13 ENCOUNTER — Ambulatory Visit: Payer: 59 | Admitting: Physical Therapy

## 2018-02-13 ENCOUNTER — Encounter: Payer: Self-pay | Admitting: Physical Therapy

## 2018-02-13 DIAGNOSIS — R42 Dizziness and giddiness: Secondary | ICD-10-CM | POA: Diagnosis not present

## 2018-02-13 NOTE — Therapy (Signed)
[facility] [address] [place], [place], [address] Phone: [phone]   Fax:  [phone]  Physical Therapy Treatment  Patient Details  Name: Connie FeltyCynthia P [facility] MRN: [id number] Date of Birth: [birth year] Referring Provider (PT): Debbra RidingJason Whitaker, [place]   Encounter Date: 02/13/2018  PT End of Session - 02/13/18 1507    Visit Number  2    Number of Visits  9    Date for PT Re-Evaluation  04/02/18    PT Start Time  1507    PT Stop Time  1550    PT Time Calculation (min)  43 min    Equipment Utilized During Treatment  --    Activity Tolerance  Patient tolerated treatment well    Behavior During Therapy  [facility] for tasks assessed/performed       Past Medical History:  Diagnosis Date  . Allergy   . Anxiety   . Depression   . Migraines   . Viral meningitis 2004    History reviewed. No pertinent surgical history.  There were no vitals filed for this visit.  Subjective Assessment - 02/13/18 1507    Subjective   Patient reports that she is doing better with focusing on the "x" and that the target did not get blurry, but the VOR exercise recreated dizziness not spinning sensation. Patient reports she did the feet together with head turns exercise standing on a pillow at home. Patient reports she has not had any further episodes of the spinning dizziness/vertigo.    Pertinent History  Patient reports she has had vertigo episodes in the past several years. 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 Patient reports she would get a bout of dizziness once a year. Patient reports her most recent significant episode was in August and patient states this episode "lasted 3 solid weeks". Patient denies having had any upper respiratory infections in August.Patient reports she has had 4 additional intermittent episodes of vertigo since August. Patient reports she did not see a physician for the episode in August. Patient reports she has not  seen an ENT in regards to her vertigo symptoms. Patient reports she began to have dizziness again in January 2020 and went to see her primary care physician. Patient reported that she tilted her head upside down to try to dry her hair and when she stood back up she began having dizziness and vertigo. Patient reports she was prescribed Meclizine and that she took it every day for a week when it was first prescribed. Patient states the last dose she took was this last Friday. Patient reports oscillopsia, nausea and sometimes vomiting, severe. Patient reports her dizziness "is worse in the bed and that is usually when it gets me and wakes me up from a dead sleep". Patient reports that currently she is getting at least one episode of dizziness each day and that the dizziness last seconds. Patient reports quick turns, turning her head up, head turns bring on her symptoms. Patient reports that she has slowed down her movements to try to avoid bringing on her symptoms. Patient reports she had viral menigitis about 15 years ago in 09/2002 and she started having migraines after that time. Patient reports she does not get migraines often, maybe two or three a year. Patient reports she went to [facility] in [place] for a little while. She was on Emitrex which she took daily for 4-5 years. Patient reports she has not been on  Emitrex now in about 10 years. Patient reports she experienced a migraine and dizziness/vertigo in January 2020 but reports this is the only time that she has had a migraine occur at the same time as the vertigo.        Patient reports that she is doing better with focusing on the "x" and that the target did not get blurry, but the VOR exercise recreated dizziness not spinning sensation. Patient reports she did the feet together with head turns exercise standing on a pillow at home. Patient reports she has not had any further episodes of the spinning dizziness/vertigo.  Neuromuscular  Re-education: VOR X 1 exercise:  Patient performed VOR X 1 horizontal in standing 3 reps of 1 minute each. Patient demonstrating good technique and speed of head movement.  Patient reports the target is staying in focus and reported 6/10 dizziness with first rep where target was placed on the mirror and then 2/10 when the target was moved to plain background. Progressed HEP to standing VOR 1 minute reps with plain background.    Walking while scanning for visual targets: Performed 170' trials of forwards and retro ambulation while scanning for visual targets in hallway with SBA. Patient noted to have a few minor episodes of veering with forward and retro ambulation taht patient was able to self-correct.  Patient reports 1-2/10 dizziness with this activity.  Airex pad:  On Airex pad, patient performed feet together and semi-tandem progressions with alternating lead leg with and without body turns and horizontal and vertical head turns.  Patient reports mild increase to 2/10 dizziness with body and head turns. Patient reports body turns are challenging to her balance.  Added body turns to HEP.   Body Wall Rolls:  Patient performed 4 reps of supported, body wall rolls with eyes open.  Patient reports 2-3/10 dizziness with this activity.   Newman PiesBall toss to self:  Patient performed sitting while tossing ball to self horizontal and then vertical while tracking ball with head and eyes.  Patient then performed static standing while tossing ball to self horizontal and then vertical while tracking ball with head and eyes.  Patient then performed 3075' trials of forward ambulation while tossing ball to self horizontal and then vertical while tracking ball with head and eyes. Patient reports 1-2/10 dizziness with vertical head turns but no increase with horizontal head turns.Geryl Councilman.   Bounce Passes: Patient performed ambulation 4475' trials while doing alternating sides bounce passes to self with ball while  tracking ball with eyes and head.   Patient reports that she has not had any further episodes of vertigo and that she has been compliant with HEP. Patient able to work on progressions of vestibular and balance exercises this date. Patient challenged by body turns on compliant surface, VOR with and without conflicting background in standing, body wall rolls and ambulation with head turning. Patient's HEP was progressed to include standing VOR with non-conflicting background and compliant surface heel-toe progressions and feet together with body turns and head turns. Patient would benefit from continued PT services to work towards goals as set on plan of care.    PT Education - 02/13/18 1507    Education Details  reviewed HEP; issued semi-tandem stance progressions and body turns; provided pages 1,3,4 and 5 from vestibular hand-out    Person(s) Educated  Patient    Methods  Explanation;Demonstration;Handout;Verbal cues    Comprehension  Verbalized understanding;Returned demonstration       PT Short Term Goals - 02/06/18  1553      PT SHORT TERM GOAL #1   Title  Pt will be independent with HEP in order to improve balance and decrease dizziness symptoms in order to improve function at home and work.    Time  4    Period  Weeks    Status  New    Target Date  03/05/18        PT Long Term Goals - 02/06/18 1553      PT LONG TERM GOAL #1   Title  Patient will report 50% or greater improvement in her symptoms of dizziness and imbalance with provoking motions or positions.    Time  8    Period  Weeks    Status  New    Target Date  04/02/18      PT LONG TERM GOAL #2   Title  Patient will reduce perceived disability to low levels as indicated by <40 on Dizziness Handicap Inventory to demonstrate significant improvement in dizziness.    Baseline  54/100 on 02/05/18    Time  8    Period  Weeks    Status  New    Target Date  04/02/18      PT LONG TERM GOAL #3   Title  Patient will  demonstrate decreased falls risk as indicated by Activities Specific Balance Confidence Scale score of 80% or greater.    Baseline  scored 64.3% on 02/05/18    Time  8    Period  Weeks    Status  New    Target Date  04/02/18            Plan - 02/18/18 1545    Clinical Impression Statement  Patient reports that she has not had any further episodes of vertigo and that she has been compliant with HEP. Patient able to work on progressions of vestibular and balance exercises this date. Patient challenged by body turns on compliant surface, VOR with and without conflicting background in standing, body wall rolls and ambulation with head turning. Patient's HEP was progressed to include standing VOR with non-conflicting background and compliant surface heel-toe progressions and feet together with body turns and head turns. Patient would benefit from continued PT services to work towards goals as set on plan of care.     Rehab Potential  Good    PT Frequency  1x / week    PT Duration  8 weeks    PT Treatment/Interventions  Canalith Repostioning;Vestibular;Gait training;Stair training;Therapeutic activities;Therapeutic exercise;Balance training;Neuromuscular re-education;Patient/family education    PT Next Visit Plan  Review HEP; try deep head hang test for BPPV if pt reports having had any vertigo since last session; amb with ball toss over shoulder, body wall roll progressions    PT Home Exercise Plan  VOR X 1 in sitting, FT on pillow with horizontal and vertical head turns    Consulted and Agree with Plan of Care  Patient       Patient will benefit from skilled therapeutic intervention in order to improve the following deficits and impairments:  Dizziness, Decreased balance, Difficulty walking  Visit Diagnosis: Dizziness and giddiness     Problem List Patient Active Problem List   Diagnosis Date Noted  . Hypothyroidism 02/07/2017  . Hoarseness 02/07/2017  . Leukocytosis 01/17/2017    Mardelle Matte PT, DPT [phone] Mardelle Matte 02/13/2018, 3:53 PM  [place] [facility] MAIN [facility] SERVICES [address] [place], [place], [address] Phone: [phone]   Fax:  [phone]  Name: Connie Oneal MRN: [id number] Date of Birth: 1975-01-17

## 2018-02-20 ENCOUNTER — Ambulatory Visit: Payer: 59 | Admitting: Physical Therapy

## 2018-02-27 ENCOUNTER — Ambulatory Visit: Payer: 59 | Attending: Family Medicine | Admitting: Physical Therapy

## 2018-02-27 ENCOUNTER — Encounter: Payer: Self-pay | Admitting: Physical Therapy

## 2018-02-27 DIAGNOSIS — R42 Dizziness and giddiness: Secondary | ICD-10-CM | POA: Diagnosis not present

## 2018-02-27 NOTE — Therapy (Signed)
[place] [facility] [address] [place], [place], [address] Phone: [phone]   Fax:  [phone]  Physical Therapy Treatment  Patient Details  [facility] MRN: [id number] Date of Birth: 05/06/1975 Referring Provider (PT): Debbra RidingJason Whitaker, [place]   Encounter Date: 02/27/2018  PT End of Session - 02/27/18 1511    Visit Number  3    Number of Visits  9    Date for PT Re-Evaluation  04/02/18    PT Start Time  0311    Activity Tolerance  Patient tolerated treatment well    Behavior During Therapy  [facility] for tasks assessed/performed       Past Medical History:  Diagnosis Date  . Allergy   . Anxiety   . Depression   . Migraines   . Viral meningitis 2004    History reviewed. No pertinent surgical history.  There were no vitals filed for this visit.  Subjective Assessment - 02/27/18 1510    Subjective  Patient states she "can tell a difference and I can feel it". Patient reports no dizziness now when doing VOR X 1 exercise at home.   Pertinent History  Patient reports she has had vertigo episodes in the past several years. 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 Patient reports she would get a bout of dizziness once a year. Patient reports her most recent significant episode was in August and patient states this episode "lasted 3 solid weeks". Patient denies having had any upper respiratory infections in August.Patient reports she has had 4 additional intermittent episodes of vertigo since August. Patient reports she did not see a physician for the episode in August. Patient reports she has not seen an ENT in regards to her vertigo symptoms. Patient reports she began to have dizziness again in January 2020 and went to see her primary care physician. Patient reported that she tilted her head upside down to try to dry her hair and when she stood back up she began having dizziness and vertigo. Patient reports she was  prescribed Meclizine and that she took it every day for a week when it was first prescribed. Patient states the last dose she took was this last Friday. Patient reports oscillopsia, nausea and sometimes vomiting, severe. Patient reports her dizziness "is worse in the bed and that is usually when it gets me and wakes me up from a dead sleep". Patient reports that currently she is getting at least one episode of dizziness each day and that the dizziness last seconds. Patient reports quick turns, turning her head up, head turns bring on her symptoms. Patient reports that she has slowed down her movements to try to avoid bringing on her symptoms. Patient reports she had viral menigitis about 15 years ago in 09/2002 and she started having migraines after that time. Patient reports she does not get migraines often, maybe two or three a year. Patient reports she went to [facility] in [place] for a little while. She was on Emitrex which she took daily for 4-5 years. Patient reports she has not been on Emitrex now in about 10 years. Patient reports she experienced a migraine and dizziness/vertigo in January 2020 but reports this is the only time that she has had a migraine occur at the same time as the vertigo.        Patient states she "can tell a difference and I can feel it". Patient reports no dizziness now when  doing VOR X 1 exercise.    Neuromuscular Re-education:  VOR X 1 exercise:  Patient performed VOR X 1 horizontal in standing with conflicting background 3 reps of 1 minute each with verbal cues for technique.   Body Wall Rolls:  Patient performed 4 reps of unsupported, body wall rolls with eyes open. Patient performed a few reps of body wall rolls with eyes closed.   Patient reports mild increase in dizziness with these activities.   Newman Pies toss to self:  Patient performed static standing and then dynamic ambulation 75' while tossing ball to self horizontal and then vertical while  tracking ball with head and eyes.   Ambulation with head turns:   Patient performed 59' trials of forwards and retro ambulation with horizontal and vertical head turns with CGA.   Patient demonstrates mild veering at times with retro ambulation. Patient reports mild dizziness and unsteadiness with retro ambulation.   Newman Pies toss over shoulder: Patient performed multiple 3' trials of forward and retro ambulation while tossing ball over one shoulder with return catch over opposite shoulder with CGA. Patient performed multiple 81' trials of forward and retro ambulation while tossing ball over one shoulder with return catch over opposite shoulder varying the ball position to head, shoulder and waist level to promote head turning. Patient noted to have mild veering at times increased with retro ambulation but able to self-correct and patient reports mild dizziness and unsteadiness.     Patient is making good progress and is able to work on progressions of activities including VOR X 1 with conflicting background and ambulation with ball toss over shoulder with head and eye follows. Patient does report mild increase in unsteadiness and dizziness symptoms with retro ambulation with head turning activities. Patient reports compliance with home exercise program. Patient would benefit from continued PT services to further address goals and to try to further reduce patient's subjective symptoms.    PT Education - 02/27/18 1510    Education Details  R    Person(s) Educated  Patient    Methods  Explanation;Demonstration    Comprehension  Verbalized understanding;Returned demonstration       PT Short Term Goals - 02/06/18 1553      PT SHORT TERM GOAL #1   Title  Pt will be independent with HEP in order to improve balance and decrease dizziness symptoms in order to improve function at home and work.    Time  4    Period  Weeks    Status  New    Target Date  03/05/18        PT Long Term Goals -  02/06/18 1553      PT LONG TERM GOAL #1   Title  Patient will report 50% or greater improvement in her symptoms of dizziness and imbalance with provoking motions or positions.    Time  8    Period  Weeks    Status  New    Target Date  04/02/18      PT LONG TERM GOAL #2   Title  Patient will reduce perceived disability to low levels as indicated by <40 on Dizziness Handicap Inventory to demonstrate significant improvement in dizziness.    Baseline  54/100 on 02/05/18    Time  8    Period  Weeks    Status  New    Target Date  04/02/18      PT LONG TERM GOAL #3   Title  Patient will demonstrate decreased falls  risk as indicated by Activities Specific Balance Confidence Scale score of 80% or greater.    Baseline  scored 64.3% on 02/05/18    Time  8    Period  Weeks    Status  New    Target Date  04/02/18            Plan - 02/27/18 1511    Clinical Impression Statement   Patient is making good progress and is able to work on progressions of activities including VOR X 1 with conflicting background and ambulation with ball toss over shoulder with head and eye follows. Patient does report mild increase in unsteadiness and dizziness symptoms with retro ambulation with head turning activities. Patient reports compliance with home exercise program. Patient would benefit from continued PT services to further address goals and to try to further reduce patient's subjective symptoms.    Rehab Potential  Good    PT Frequency  1x / week    PT Duration  8 weeks    PT Treatment/Interventions  Canalith Repostioning;Vestibular;Gait training;Stair training;Therapeutic activities;Therapeutic exercise;Balance training;Neuromuscular re-education;Patient/family education    PT Next Visit Plan  Review HEP; try deep head hang test for BPPV if pt reports having had any vertigo since last session; amb with ball toss over shoulder, body wall roll progressions    PT Home Exercise Plan  VOR X 1 in sitting, FT  on pillow with horizontal and vertical head turns    Consulted and Agree with Plan of Care  Patient       Patient will benefit from skilled therapeutic intervention in order to improve the following deficits and impairments:  Dizziness, Decreased balance, Difficulty walking  Visit Diagnosis: Dizziness and giddiness     Problem List Patient Active Problem List   Diagnosis Date Noted  . Hypothyroidism 02/07/2017  . Hoarseness 02/07/2017  . Leukocytosis 01/17/2017   Mardelle Matte PT, DPT [phone] Mardelle Matte 02/27/2018, 3:14 PM  [place] [facility] MAIN [facility] SERVICES [address] [place], [place], [address] Phone: [phone]   Fax:  [phone]  Name: TWANISHA MAILEY MRN: [id number] Date of Birth: 03-Jan-1976

## 2018-03-08 ENCOUNTER — Ambulatory Visit: Payer: 59 | Admitting: Physical Therapy

## 2018-03-11 ENCOUNTER — Encounter: Payer: Self-pay | Admitting: Physical Therapy

## 2018-03-11 ENCOUNTER — Ambulatory Visit: Payer: 59 | Admitting: Physical Therapy

## 2018-03-11 DIAGNOSIS — R42 Dizziness and giddiness: Secondary | ICD-10-CM | POA: Diagnosis not present

## 2018-03-19 NOTE — Therapy (Signed)
[place] [facility] MAIN [facility] SERVICES [address] [place], [place], [address] Phone: [phone]   Fax:  [phone]  Physical Therapy Treatment  Patient Details  Name: Connie Oneal [facility] MRN: [id number] Date of Birth: Jun 18, 1975 Referring Provider (PT): Debbra Riding, [place]   Encounter Date: 03/11/2018    Past Medical History:  Diagnosis Date  . Allergy   . Anxiety   . Depression   . Migraines   . Viral meningitis 2004    History reviewed. No pertinent surgical history.  There were no vitals filed for this visit.                              PT Short Term Goals - 03/11/18 1515      PT SHORT TERM GOAL #1   Title  Pt will be independent with HEP in order to improve balance and decrease dizziness symptoms in order to improve function at home and work.    Time  4    Period  Weeks    Status  Achieved    Target Date  03/05/18        PT Long Term Goals - 03/11/18 1516      PT LONG TERM GOAL #1   Title  Patient will report 50% or greater improvement in her symptoms of dizziness and imbalance with provoking motions or positions.    Baseline  patient reports 95% improvement    Time  8    Period  Weeks    Status  Achieved      PT LONG TERM GOAL #2   Title  Patient will reduce perceived disability to low levels as indicated by <40 on Dizziness Handicap Inventory to demonstrate significant improvement in dizziness.    Baseline  54/100 on 02/05/18; scored 4/100 on 03/11/18    Time  8    Period  Weeks    Status  Achieved      PT LONG TERM GOAL #3   Title  Patient will demonstrate decreased falls risk as indicated by Activities Specific Balance Confidence Scale score of 80% or greater.    Baseline  scored 64.3% on 02/05/18; scored  97.5% on 03/11/2018    Time  8    Period  Weeks    Status  Achieved              Patient will benefit from skilled therapeutic intervention in order to improve the following  deficits and impairments:  Dizziness, Decreased balance, Difficulty walking  Visit Diagnosis: Dizziness and giddiness     Problem List Patient Active Problem List   Diagnosis Date Noted  . Hypothyroidism 02/07/2017  . Hoarseness 02/07/2017  . Leukocytosis 01/17/2017    , 03/19/2018, 8:17 AM  [place] [facility] MAIN [facility] SERVICES [address] [place], [place], [address] Phone: [phone]   Fax:  [phone]  Name: Connie Oneal [facility] MRN: [id number] Date of Birth: 1975-09-06

## 2020-02-13 ENCOUNTER — Encounter: Payer: Self-pay | Admitting: Emergency Medicine

## 2020-02-13 ENCOUNTER — Ambulatory Visit
Admission: EM | Admit: 2020-02-13 | Discharge: 2020-02-13 | Disposition: A | Discharge status: Home / Self Care | Payer: BC Managed Care – PPO | Attending: Emergency Medicine | Admitting: Emergency Medicine

## 2020-02-13 ENCOUNTER — Other Ambulatory Visit: Payer: Self-pay

## 2020-02-13 DIAGNOSIS — H60392 Other infective otitis externa, left ear: Secondary | ICD-10-CM

## 2020-02-13 MED ORDER — NEOMYCIN-POLYMYXIN-HC 3.5-10000-1 OT SUSP: 4.0000 [drp] | Freq: Three times a day (TID) | OTIC | 0 refills | Status: DC

## 2020-02-13 NOTE — Discharge Instructions (Addendum)
Put 4 drops of Cortisporin otic in your left ear 3 times a day for 5 days for treatment of the infection.  Use over-the-counter ibuprofen and Tylenol as needed for pain control.  You can also use a heating pad or hot water bottle in your pillowcase at night for pain control.

## 2020-02-13 NOTE — ED Triage Notes (Signed)
Patient c/o left ear pain that started yesterday. Denies any other symptoms.  

## 2020-02-13 NOTE — ED Provider Notes (Signed)
[facility]    CSN: [id number] Arrival date & time: 02/13/20  1555      History   Chief Complaint Chief Complaint  Patient presents with  . Ear Pain    HPI Connie Oneal is a 45 y.o. female.   HPI   45 year old female here for evaluation of left ear pain.  Patient reports that she has been having pain in her left ear for last 3 to 4 days.  Patient reports that she has itchy ear canals frequently and she had used her fingernail to scratch her canal and thinks she caused a cut.  She states she can feel a scab inside of her ear.  Patient reports that she has had intermittent ringing in her ears and she did wake up the last 2 mornings with drainage on her pillow.  Patient has had some intermittent episodes of dizziness but no fever.  Past Medical History:  Diagnosis Date  . Allergy   . Anxiety   . Depression   . Migraines   . Viral meningitis 2004    Patient Active Problem List   Diagnosis Date Noted  . Hypothyroidism 02/07/2017  . Hoarseness 02/07/2017  . Leukocytosis 01/17/2017    History reviewed. No pertinent surgical history.  OB History   No obstetric history on file.      Home Medications    Prior to Admission medications   Medication Sig Start Date End Date Taking? Authorizing Provider  fexofenadine (ALLEGRA) 180 MG tablet Take 180 mg by mouth daily. 12/27/16  Yes [provider]  hydrochlorothiazide (HYDRODIURIL) 25 MG tablet Take 25 mg by mouth daily. 12/27/16  Yes [provider]  levothyroxine (SYNTHROID, LEVOTHROID) 25 MCG tablet Take 25 mcg by mouth daily before breakfast.   Yes [provider]  meclizine (ANTIVERT) 25 MG tablet Take 25 mg by mouth 3 (three) times daily as needed for dizziness.   Yes [provider]  neomycin-polymyxin-hydrocortisone (CORTISPORIN) 3.5-10000-1 OTIC suspension Place 4 drops into the left ear 3 (three) times daily. 02/13/20  Yes Becky Augusta, NP  PARoxetine (PAXIL) 20 MG  tablet Take 20 mg by mouth daily. 11/02/16  Yes [provider]    Family History Family History  Problem Relation Age of Onset  . Breast cancer Paternal Aunt 58  . Breast cancer Maternal Aunt        mat great aunt    Social History Social History   Tobacco Use  . Smoking status: Current Every Day Smoker    Packs/day: 0.50    Years: 25.00    Pack years: 12.50    Types: Cigarettes  . Smokeless tobacco: Never Used  Substance Use Topics  . Alcohol use: Yes    Comment: Occasional     Allergies   Codeine   Review of Systems Review of Systems  Constitutional: Negative for fever.  HENT: Positive for ear discharge, ear pain and tinnitus.   Skin: Negative for rash.  Neurological: Positive for dizziness.  Hematological: Negative.   Psychiatric/Behavioral: Negative.      Physical Exam Triage Vital Signs ED Triage Vitals  Enc Vitals Group     BP 02/13/20 1606 115/86     Pulse Rate 02/13/20 1606 68     Resp 02/13/20 1606 18     Temp --      Temp src --      SpO2 02/13/20 1606 98 %     Weight 02/13/20 1605 173 lb 1 oz (78.5 kg)  Height 02/13/20 1605 5\' 8"  (1.727 m)     Head Circumference --      Peak Flow --      Pain Score 02/13/20 1604 5     Pain Loc --      Pain Edu? --      Excl. in GC? --    No data found.  Updated Vital Signs BP 115/86 (BP Location: Right Arm)   Pulse 68   Resp 18   Ht 5\' 8"  (1.727 m)   Wt 173 lb 1 oz (78.5 kg)   SpO2 98%   BMI 26.31 kg/m   Visual Acuity Right Eye Distance:   Left Eye Distance:   Bilateral Distance:    Right Eye Near:   Left Eye Near:    Bilateral Near:     Physical Exam Vitals and nursing note reviewed.  Constitutional:      General: She is not in acute distress.    Appearance: Normal appearance. She is normal weight. She is not ill-appearing.  HENT:     Head: Normocephalic and atraumatic.     Right Ear: Tympanic membrane, ear canal and external ear normal.     Left Ear: Tympanic membrane  and external ear normal.     Ears:     Comments: Left EAC is erythematous and edematous.  There is a scab at the 12 o'clock position in the external auditory canal.  No drainage or debris noted. Skin:    General: Skin is warm and dry.     Capillary Refill: Capillary refill takes less than 2 seconds.  Neurological:     General: No focal deficit present.     Mental Status: She is alert and oriented to person, place, and time.  Psychiatric:        Mood and Affect: Mood normal.        Behavior: Behavior normal.        Thought Content: Thought content normal.        Judgment: Judgment normal.      UC Treatments / Results  Labs (all labs ordered are listed, but only abnormal results are displayed) Labs Reviewed - No data to display  EKG   Radiology No results found.  Procedures Procedures (including critical care time)  Medications Ordered in UC Medications - No data to display  Initial Impression / Assessment and Plan / UC Course  I have reviewed the triage vital signs and the nursing notes.  Pertinent labs & imaging results that were available during my care of the patient were reviewed by me and considered in my medical decision making (see chart for details).   Been of 3 to 4 days of left ear pain.  Patient's physical exam does reveal an erythematous and edematous external auditory canal with a scab in the superior aspect.  Patient's physical exam is consistent with otitis externa.  Will treat with Cortisporin otic 4 drops 3 times daily x5 days.   Final Clinical Impressions(s) / UC Diagnoses   Final diagnoses:  Infective otitis externa of left ear     Discharge Instructions     Put 4 drops of Cortisporin otic in your left ear 3 times a day for 5 days for treatment of the infection.  Use over-the-counter ibuprofen and Tylenol as needed for pain control.  You can also use a heating pad or hot water bottle in your pillowcase at night for pain control.    ED  Prescriptions    Medication Sig  Dispense Auth. Provider   neomycin-polymyxin-hydrocortisone (CORTISPORIN) 3.5-10000-1 OTIC suspension Place 4 drops into the left ear 3 (three) times daily. 10 mL Becky Augusta, NP     PDMP not reviewed this encounter.   Becky Augusta, NP 02/13/20 1645

## 2020-03-11 IMAGING — MG DIGITAL SCREENING BILATERAL MAMMOGRAM WITH TOMO AND CAD
8 series · 8 of 24 positions shown · non-contrast
Comparison: Previous exam(s).

ACR Breast Density Category a: The breast tissue is almost entirely
fatty.

CLINICAL DATA: Screening.

EXAM:
DIGITAL SCREENING BILATERAL MAMMOGRAM WITH TOMO AND CAD

[R CC synth-2D]
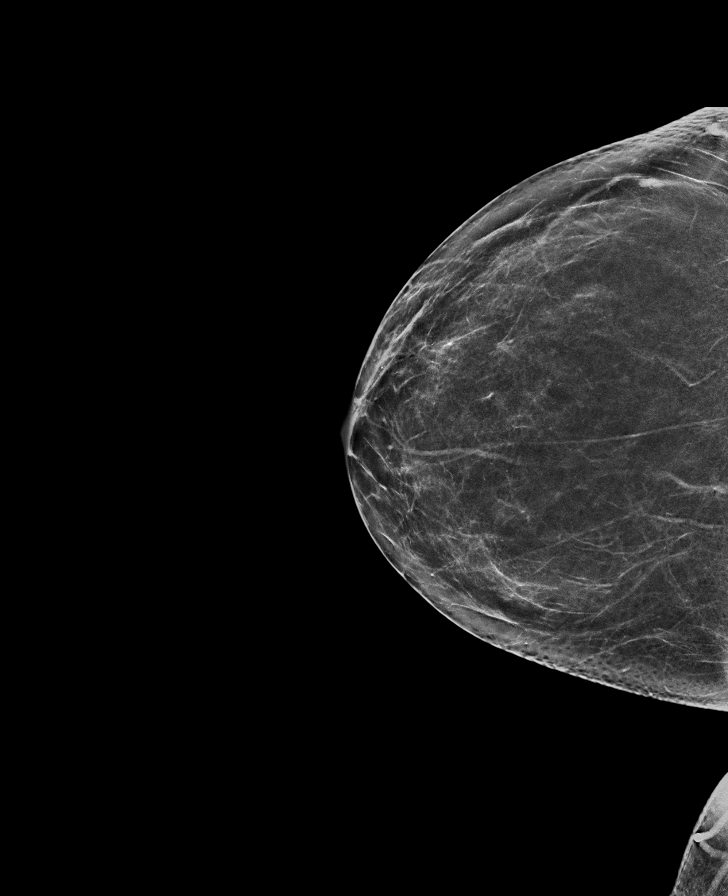

[R MLO synth-2D]
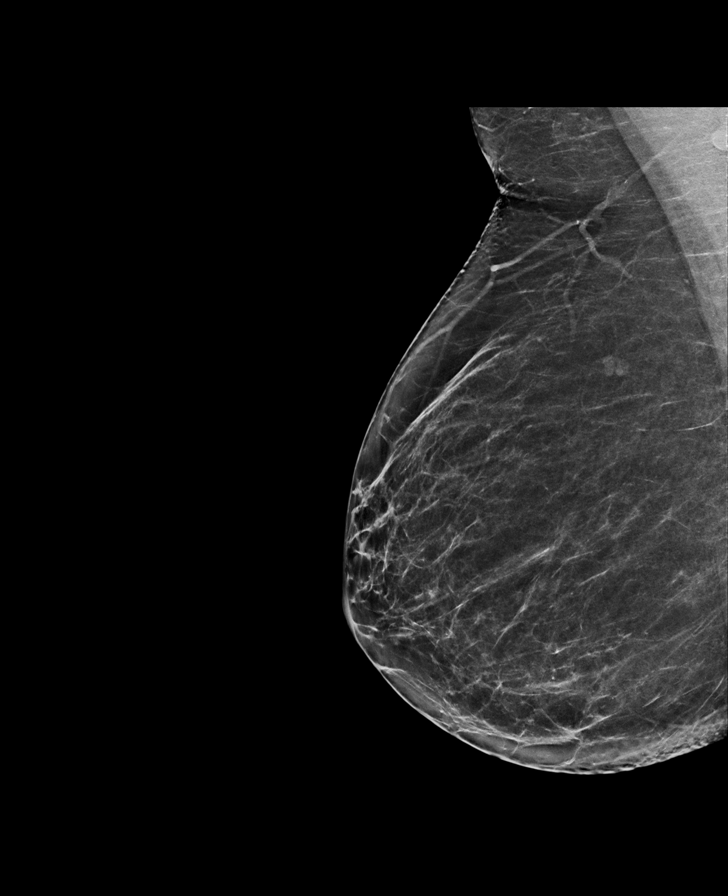

[L MLO synth-2D]
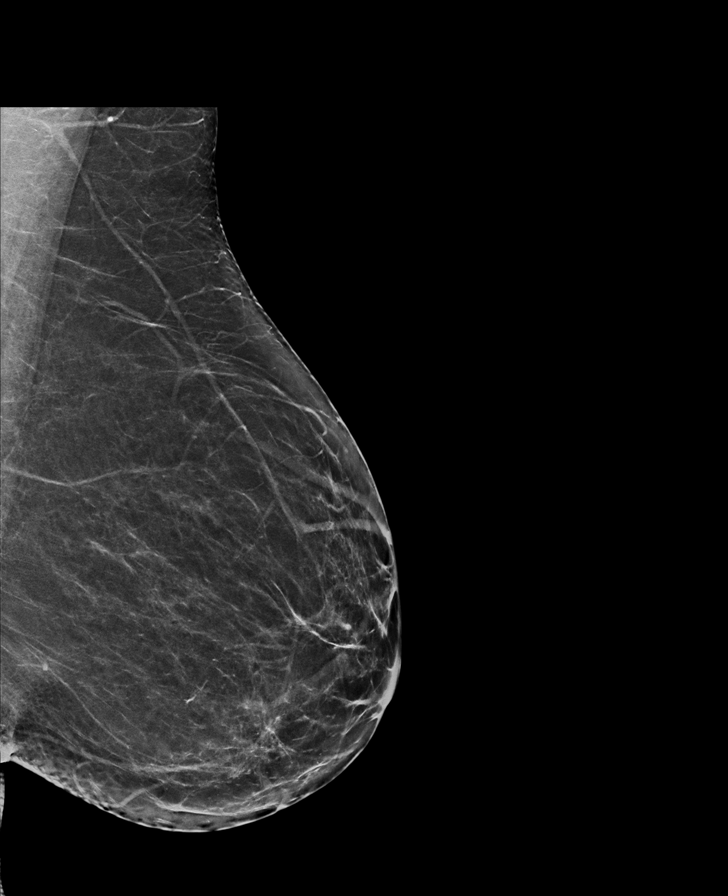

[L CC synth-2D]
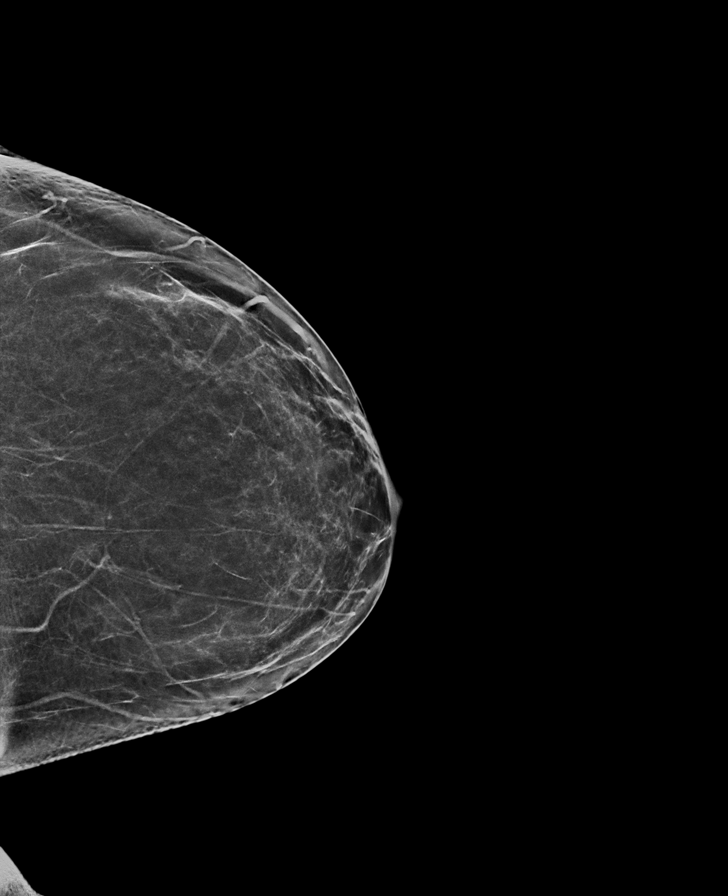

[R MLO tomo · tomo slice 38/75.0]
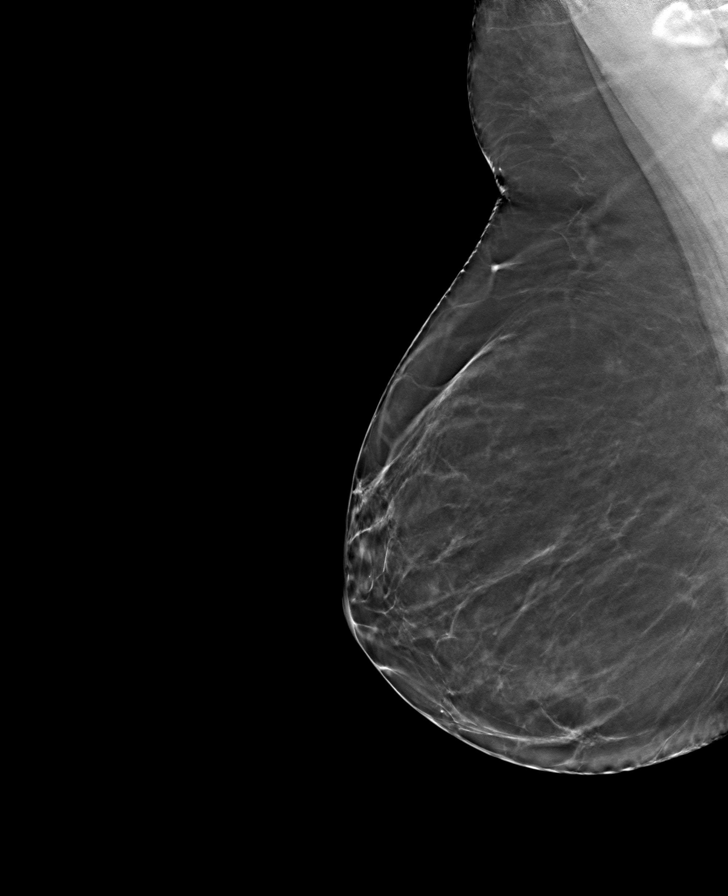

[R CC tomo · tomo slice 36/71.0]
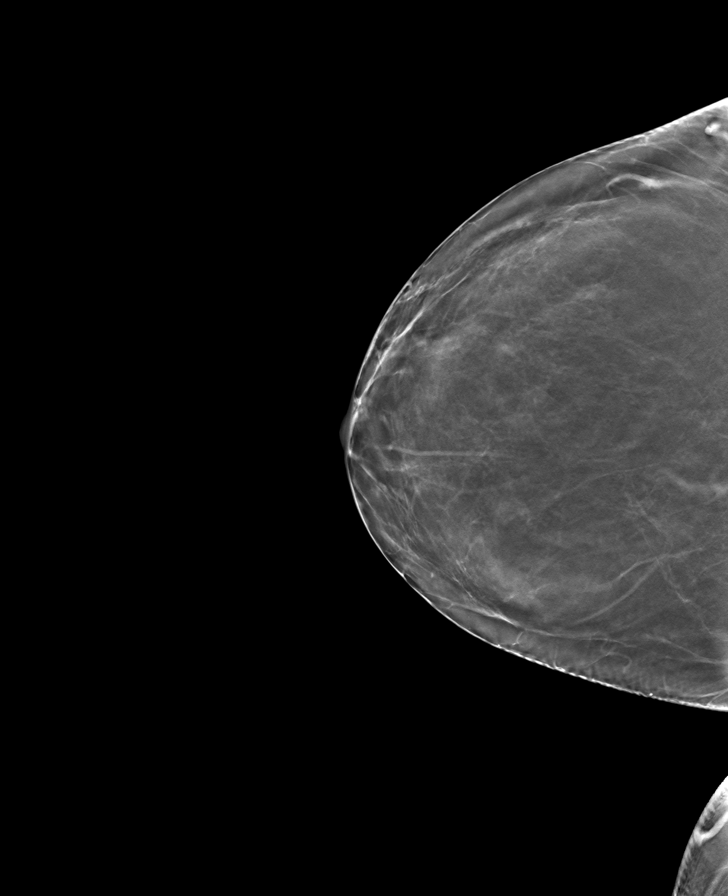

[L CC tomo · tomo slice 35/68.0]
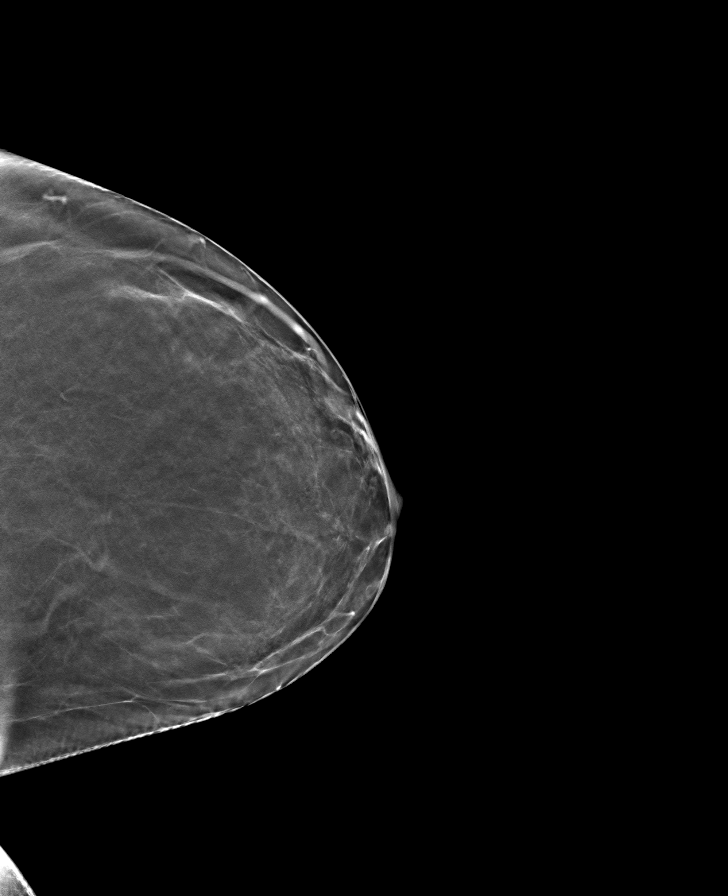

[L MLO tomo · tomo slice 35/70.0]
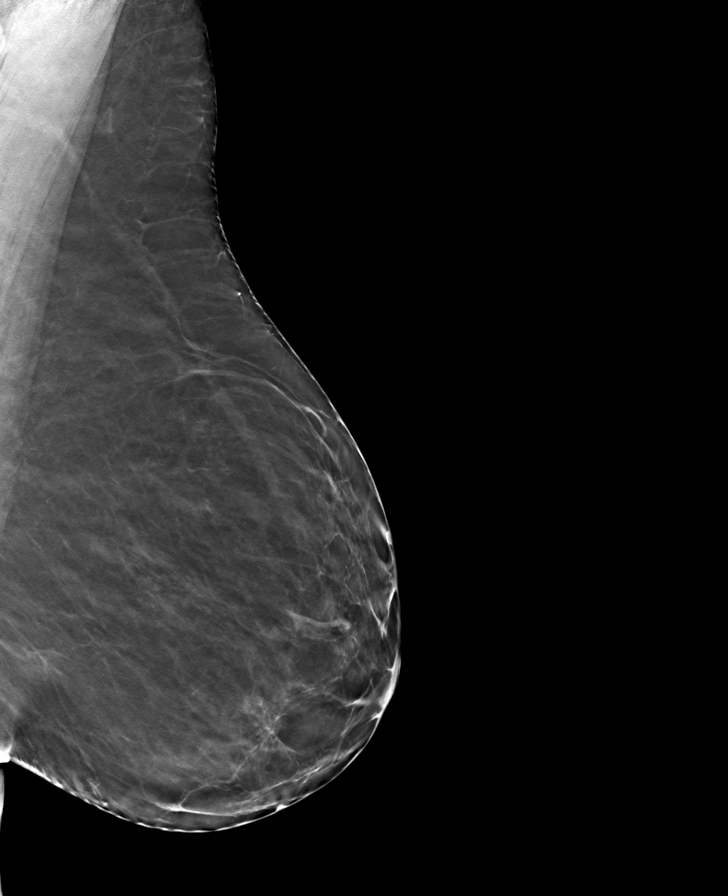

[8 of 24 positions shown; findings below may reference images not displayed]

FINDINGS: There are no findings suspicious for malignancy. Images were
processed with CAD.
IMPRESSION: No mammographic evidence of malignancy. A result letter of this
screening mammogram will be mailed directly to the patient.

RECOMMENDATION:
Screening mammogram in one year. (Code:[id number]-[id number])

BI-RADS CATEGORY  1: Negative.

## 2021-10-27 ENCOUNTER — Other Ambulatory Visit: Payer: Self-pay

## 2021-10-27 ENCOUNTER — Ambulatory Visit
Admission: RE | Admit: 2021-10-27 | Discharge: 2021-10-27 | Disposition: A | Discharge status: Home / Self Care | Payer: BC Managed Care – PPO | Source: Ambulatory Visit

## 2021-10-27 VITALS — BP 113/84 | HR 56 | Temp 98.2°F | Resp 17 | Ht 68.0 in | Wt 160.0 lb

## 2021-10-27 DIAGNOSIS — J069 Acute upper respiratory infection, unspecified: Secondary | ICD-10-CM | POA: Diagnosis not present

## 2021-10-27 DIAGNOSIS — J209 Acute bronchitis, unspecified: Secondary | ICD-10-CM | POA: Diagnosis not present

## 2021-10-27 MED ORDER — IPRATROPIUM BROMIDE 0.06 % NA SOLN: 2.0000 | Freq: Four times a day (QID) | NASAL | 12 refills | Status: DC

## 2021-10-27 MED ORDER — ALBUTEROL SULFATE HFA 108 (90 BASE) MCG/ACT IN AERS: 2.0000 | INHALATION_SPRAY | RESPIRATORY_TRACT | 0 refills | Status: DC | PRN

## 2021-10-27 MED ORDER — PREDNISONE 20 MG PO TABS: 60.0000 mg | ORAL_TABLET | Freq: Every day | ORAL | 0 refills | Status: AC

## 2021-10-27 MED ORDER — AEROCHAMBER MV MISC: 2 refills | Status: DC

## 2021-10-27 MED ORDER — BENZONATATE 100 MG PO CAPS: 200.0000 mg | ORAL_CAPSULE | Freq: Three times a day (TID) | ORAL | 0 refills | Status: DC

## 2021-10-27 MED ORDER — PROMETHAZINE-DM 6.25-15 MG/5ML PO SYRP: 5.0000 mL | ORAL_SOLUTION | Freq: Four times a day (QID) | ORAL | 0 refills | Status: DC | PRN

## 2021-10-27 NOTE — ED Provider Notes (Signed)
[facility]    CSN: [id number] Arrival date & time: 10/27/21  1337      History   Chief Complaint Chief Complaint  Patient presents with   Nasal Congestion    HPI Ferrin Liebig Paschen is a 46 y.o. female.   HPI  21 old female here for evaluation respiratory complaints.  Patient reports that her symptoms began last Thursday (1 week ago) and consisted of fever, nasal congestion, clear nasal discharge, cough, wheezing, shortness of breath, and diarrhea.  She states that her fever broke 3 days ago but she has continued to have a cough and nasal congestion.  Past Medical History:  Diagnosis Date   Allergy    Anxiety    Depression    Migraines    Viral meningitis 2004    Patient Active Problem List   Diagnosis Date Noted   Hypothyroidism 02/07/2017   Hoarseness 02/07/2017   Leukocytosis 01/17/2017    History reviewed. No pertinent surgical history.  OB History   No obstetric history on file.      Home Medications    Prior to Admission medications   Medication Sig Start Date End Date Taking? Authorizing Provider  albuterol (VENTOLIN HFA) 108 (90 Base) MCG/ACT inhaler Inhale 2 puffs into the lungs every 4 (four) hours as needed. 10/27/21  Yes Becky Augusta, NP  benzonatate (TESSALON) 100 MG capsule Take 2 capsules (200 mg total) by mouth every 8 (eight) hours. 10/27/21  Yes Becky Augusta, NP  ipratropium (ATROVENT) 0.06 % nasal spray Place 2 sprays into both nostrils 4 (four) times daily. 10/27/21  Yes Becky Augusta, NP  levonorgestrel (MIRENA) 20 MCG/DAY IUD 1 each by Intrauterine route once.   Yes [provider]  predniSONE (DELTASONE) 20 MG tablet Take 3 tablets (60 mg total) by mouth daily with breakfast for 5 days. 3 tablets daily for 5 days. 10/27/21 11/01/21 Yes Becky Augusta, NP  promethazine-dextromethorphan (PROMETHAZINE-DM) 6.25-15 MG/5ML syrup Take 5 mLs by mouth 4 (four) times daily as needed. 10/27/21  Yes Becky Augusta, NP   Spacer/Aero-Holding Chambers (AEROCHAMBER MV) inhaler Use as instructed 10/27/21  Yes Becky Augusta, NP  ALPRAZolam Prudy Feeler) 0.5 MG tablet Take 0.5 mg by mouth 2 (two) times daily as needed. 05/29/21   [provider]  busPIRone (BUSPAR) 5 MG tablet Take 5 mg by mouth 2 (two) times daily. 08/25/21   [provider]  fexofenadine (ALLEGRA) 180 MG tablet Take 180 mg by mouth daily. 12/27/16   [provider]  fluticasone (FLONASE) 50 MCG/ACT nasal spray Place 2 sprays into both nostrils daily. 08/23/21   [provider]  hydrochlorothiazide (HYDRODIURIL) 25 MG tablet Take 25 mg by mouth daily. 12/27/16   [provider]  levothyroxine (SYNTHROID, LEVOTHROID) 25 MCG tablet Take 25 mcg by mouth daily before breakfast.    [provider]  PARoxetine (PAXIL) 20 MG tablet Take 20 mg by mouth daily. 11/02/16   [provider]    Family History Family History  Problem Relation Age of Onset   Breast cancer Paternal Aunt 64   Breast cancer Maternal Aunt        mat great aunt    Social History Social History   Tobacco Use   Smoking status: Every Day    Packs/day: 0.50    Years: 25.00    Total pack years: 12.50    Types: Cigarettes   Smokeless tobacco: Never  Vaping Use   Vaping Use: Never used  Substance Use Topics  Alcohol use: Yes    Comment: Occasional   Drug use: Yes    Types: Marijuana    Comment: last use yesterday     Allergies   Codeine   Review of Systems Review of Systems  Constitutional:  Positive for fever.  HENT:  Positive for congestion and rhinorrhea. Negative for ear pain and sore throat.   Respiratory:  Positive for cough, shortness of breath and wheezing.   Gastrointestinal:  Positive for diarrhea. Negative for nausea and vomiting.  Skin:  Negative for rash.  Hematological: Negative.   Psychiatric/Behavioral: Negative.       Physical Exam Triage Vital Signs ED Triage Vitals  Enc Vitals Group      BP 10/27/21 1404 113/84     Pulse Rate 10/27/21 1404 (!) 56     Resp 10/27/21 1404 17     Temp 10/27/21 1404 98.2 F (36.8 C)     Temp Source 10/27/21 1404 Oral     SpO2 10/27/21 1404 98 %     Weight 10/27/21 1359 160 lb (72.6 kg)     Height 10/27/21 1359 5\' 8"  (1.727 m)     Head Circumference --      Peak Flow --      Pain Score 10/27/21 1359 0     Pain Loc --      Pain Edu? --      Excl. in GC? --    No data found.  Updated Vital Signs BP 113/84 (BP Location: Left Arm)   Pulse (!) 56   Temp 98.2 F (36.8 C) (Oral)   Resp 17   Ht 5\' 8"  (1.727 m)   Wt 160 lb (72.6 kg)   SpO2 98%   BMI 24.33 kg/m   Visual Acuity Right Eye Distance:   Left Eye Distance:   Bilateral Distance:    Right Eye Near:   Left Eye Near:    Bilateral Near:     Physical Exam Vitals and nursing note reviewed.  Constitutional:      Appearance: Normal appearance. She is not ill-appearing.  HENT:     Head: Normocephalic and atraumatic.     Right Ear: Tympanic membrane, ear canal and external ear normal. There is no impacted cerumen.     Left Ear: Tympanic membrane, ear canal and external ear normal. There is no impacted cerumen.     Nose: Congestion and rhinorrhea present.     Comments: Erythematous & edematous nasal mucosa with scant clear rhinorrhea.    Mouth/Throat:     Mouth: Mucous membranes are moist.     Pharynx: Oropharynx is clear. Posterior oropharyngeal erythema present. No oropharyngeal exudate.     Comments: Posterior oropharynx demonstrates mild erythema but no injection.  There is clear postnasal drip present on exam. Cardiovascular:     Rate and Rhythm: Normal rate and regular rhythm.     Pulses: Normal pulses.     Heart sounds: Normal heart sounds. No murmur heard.    No friction rub. No gallop.  Pulmonary:     Effort: Pulmonary effort is normal.     Breath sounds: Wheezing and rhonchi present. No rales.     Comments: Diffuse wheezes and rhonchi in all lung  fields. Musculoskeletal:     Cervical back: Normal range of motion and neck supple.  Lymphadenopathy:     Cervical: No cervical adenopathy.  Skin:    General: Skin is warm and dry.     Capillary Refill: Capillary refill takes less  than 2 seconds.  Neurological:     General: No focal deficit present.     Mental Status: She is alert and oriented to person, place, and time.  Psychiatric:        Mood and Affect: Mood normal.        Behavior: Behavior normal.        Thought Content: Thought content normal.        Judgment: Judgment normal.      UC Treatments / Results  Labs (all labs ordered are listed, but only abnormal results are displayed) Labs Reviewed - No data to display  EKG   Radiology No results found.  Procedures Procedures (including critical care time)  Medications Ordered in UC Medications - No data to display  Initial Impression / Assessment and Plan / UC Course  I have reviewed the triage vital signs and the nursing notes.  Pertinent labs & imaging results that were available during my care of the patient were reviewed by me and considered in my medical decision making (see chart for details).   Patient is a pleasant, nontoxic-appearing 72 old female with a past medical history significant for migraines, anxiety, and hypothyroidism here for evaluation respiratory complaints as outlined in HPI above.  Her symptoms been present for the last week.  She took a home COVID test 3 days ago which was negative.  She is not in any respiratory distress and does not demonstrate any dyspnea or tachypnea however coughing does trigger a deep barky cough.  She has continued upper respiratory inflammation present on exam and her lung sounds have wheezes and rhonchi diffusely through all lung fields.  Her exam is consistent with an upper respiratory infection and bronchitis.-We will discharge her home with a prescription for albuterol that she can use 1 to 2 puffs every 4-6 hours  as needed for shortness of breath or wheezing.  I will also prescribe Tessalon Perles that she can use for cough during the day and Promethazine DM cough syrup that she can use at bedtime.  She states that she is coughing so much at night that she is not getting much sleep.  Additionally, with the residual nasal congestion I will prescribe Atrovent nasal spray.  I will also prescribe a prednisone burst dose over 5 days to help decrease pulmonary inflammation.  Return precaution reviewed.  Work note provided.   Final Clinical Impressions(s) / UC Diagnoses   Final diagnoses:  Upper respiratory tract infection, unspecified type  Acute bronchitis, unspecified organism     Discharge Instructions      Use the albuterol inhaler/nebulizer every 4-6 hours as needed for shortness of breath, wheezing, and cough.  Take the prednisone according to the package instructions to help with pulmonary inflammation.  Use the Tessalon Perles every 8 hours for your cough.  Taken with a small sip of water.  They may give you some numbness to the base of your tongue or metallic taste in her mouth, this is normal.  They are designed to calm down the cough reflex.  Use the Promethazine DM cough syrup at bedtime as will make you drowsy.  You may take 1 teaspoon (5 mL) every 6 hours.  Return for reevaluation for new or worsening symptoms.      ED Prescriptions     Medication Sig Dispense Auth. Provider   albuterol (VENTOLIN HFA) 108 (90 Base) MCG/ACT inhaler Inhale 2 puffs into the lungs every 4 (four) hours as needed. 18 g Becky Augusta, NP  Spacer/Aero-Holding Chambers (AEROCHAMBER MV) inhaler Use as instructed 1 each Margarette Canada, NP   ipratropium (ATROVENT) 0.06 % nasal spray Place 2 sprays into both nostrils 4 (four) times daily. 15 mL Margarette Canada, NP   benzonatate (TESSALON) 100 MG capsule Take 2 capsules (200 mg total) by mouth every 8 (eight) hours. 21 capsule Margarette Canada, NP    promethazine-dextromethorphan (PROMETHAZINE-DM) 6.25-15 MG/5ML syrup Take 5 mLs by mouth 4 (four) times daily as needed. 118 mL Margarette Canada, NP   predniSONE (DELTASONE) 20 MG tablet Take 3 tablets (60 mg total) by mouth daily with breakfast for 5 days. 3 tablets daily for 5 days. 15 tablet Margarette Canada, NP      PDMP not reviewed this encounter.   Margarette Canada, NP 10/27/21 1454

## 2021-10-27 NOTE — Discharge Instructions (Addendum)
Use the albuterol inhaler/nebulizer every 4-6 hours as needed for shortness of breath, wheezing, and cough.  Take the prednisone according to the package instructions to help with pulmonary inflammation.  Use the Tessalon Perles every 8 hours for your cough.  Taken with a small sip of water.  They may give you some numbness to the base of your tongue or metallic taste in her mouth, this is normal.  They are designed to calm down the cough reflex.  Use the Promethazine DM cough syrup at bedtime as will make you drowsy.  You may take 1 teaspoon (5 mL) every 6 hours.  Return for reevaluation for new or worsening symptoms.  

## 2021-10-27 NOTE — ED Triage Notes (Addendum)
Pt states she was sick with a "virus" over the weekend. Most sx resolved but has residual barky cough and congestion. Negative COVID test on Monday.

## 2021-11-18 ENCOUNTER — Ambulatory Visit
Admission: RE | Admit: 2021-11-18 | Discharge: 2021-11-18 | Disposition: A | Discharge status: Home / Self Care | Payer: BC Managed Care – PPO | Source: Ambulatory Visit | Attending: Emergency Medicine | Admitting: Emergency Medicine

## 2021-11-18 ENCOUNTER — Ambulatory Visit: Payer: Self-pay

## 2021-11-18 VITALS — BP 135/88 | HR 98 | Temp 98.3°F | Ht 68.0 in | Wt 160.0 lb

## 2021-11-18 DIAGNOSIS — J029 Acute pharyngitis, unspecified: Secondary | ICD-10-CM | POA: Diagnosis not present

## 2021-11-18 LAB — MONONUCLEOSIS SCREEN: Mono Screen: NEGATIVE

## 2021-11-18 LAB — GROUP A STREP BY PCR: Group A Strep by PCR: NOT DETECTED

## 2021-11-18 NOTE — ED Triage Notes (Signed)
Patient states she woke up last Friday with LT tonsil pain, then moved into middle, and then onto RT side. Pt states now pain all over throat, reports white spots back of throat. Pt denies any fevers

## 2021-11-18 NOTE — Discharge Instructions (Signed)
Your strep PCR was negative today.  We will contact you if your mono comes back positive..  1 gram of Tylenol and 600 mg ibuprofen together 3 times a day as needed for pain.  Make sure you drink plenty of extra fluids.  Some people find salt water gargles and  Traditional Medic[employer]'s "Throat Coat" tea helpful. Take 5 mL of liquid Benadryl and 5 mL of Maalox. Mix it together, and then hold it in your mouth for as long as you can and then swallow. You may do this 4 times a day.    Go to www.goodrx.com  or www.costplusdrugs.com to look up your medications. This will give you a list of where you can find your prescriptions at the most affordable prices. Or ask the pharmacist what the cash price is, or if they have any other discount programs available to help make your medication more affordable. This can be less expensive than what you would pay with insurance.

## 2021-11-18 NOTE — ED Provider Notes (Signed)
HPI  SUBJECTIVE:  Patient reports sore throat starting 4 to 5 days ago. Sx worse with exposure to cold air, sleeping with mouth open. sx better with hot tea with honey and lemon.  No fever No neck stiffness  No change in her cough from recent bronchitis  No wheezing No nasal congestion, rhinorrhea, postnasal drip No Myalgias No Headache No Rash  No loss of taste or smell No shortness of breath or difficulty breathing No nausea, vomiting No diarrhea No abdominal pain     No Recent Strep, mono, flu, COVID exposure No reflux sxs No Allergy sxs  No Breathing difficulty, voice changes, sensation of throat swelling shut No Drooling No Trismus No abx in past month.   No antipyretic in past 6 hrs  Did not get the COVID or flu vaccines. Patient has a past medical history of allergies, GERD, states has not been bothering her lately, but she does not take any medication for it.  She has a history of hypertension, hypothyroidism, depression and vertigo. PCP: [facility]   Past Medical History:  Diagnosis Date   Allergy    Anxiety    Depression    Migraines    Viral meningitis 2004    History reviewed. No pertinent surgical history.  Family History  Problem Relation Age of Onset   Breast cancer Paternal Aunt 77   Breast cancer Maternal Aunt        mat great aunt    Social History   Tobacco Use   Smoking status: Every Day    Packs/day: 0.50    Years: 25.00    Total pack years: 12.50    Types: Cigarettes   Smokeless tobacco: Never  Vaping Use   Vaping Use: Never used  Substance Use Topics   Alcohol use: Yes    Comment: Occasional   Drug use: Yes    Types: Marijuana    Comment: last use yesterday    No current facility-administered medications for this encounter.  Current Outpatient Medications:    albuterol (VENTOLIN HFA) 108 (90 Base) MCG/ACT inhaler, Inhale 2 puffs into the lungs every 4 (four) hours as needed., Disp: 18 g, Rfl: 0   ALPRAZolam  (XANAX) 0.5 MG tablet, Take 0.5 mg by mouth 2 (two) times daily as needed., Disp: , Rfl:    benzonatate (TESSALON) 100 MG capsule, Take 2 capsules (200 mg total) by mouth every 8 (eight) hours., Disp: 21 capsule, Rfl: 0   busPIRone (BUSPAR) 5 MG tablet, Take 5 mg by mouth 2 (two) times daily., Disp: , Rfl:    fexofenadine (ALLEGRA) 180 MG tablet, Take 180 mg by mouth daily., Disp: , Rfl:    fluticasone (FLONASE) 50 MCG/ACT nasal spray, Place 2 sprays into both nostrils daily., Disp: , Rfl:    hydrochlorothiazide (HYDRODIURIL) 25 MG tablet, Take 25 mg by mouth daily., Disp: , Rfl:    ipratropium (ATROVENT) 0.06 % nasal spray, Place 2 sprays into both nostrils 4 (four) times daily., Disp: 15 mL, Rfl: 12   levonorgestrel (MIRENA) 20 MCG/DAY IUD, 1 each by Intrauterine route once., Disp: , Rfl:    levothyroxine (SYNTHROID, LEVOTHROID) 25 MCG tablet, Take 25 mcg by mouth daily before breakfast., Disp: , Rfl:    PARoxetine (PAXIL) 20 MG tablet, Take 20 mg by mouth daily., Disp: , Rfl:    promethazine-dextromethorphan (PROMETHAZINE-DM) 6.25-15 MG/5ML syrup, Take 5 mLs by mouth 4 (four) times daily as needed., Disp: 118 mL, Rfl: 0   Spacer/Aero-Holding Chambers (AEROCHAMBER MV) inhaler,  Use as instructed, Disp: 1 each, Rfl: 2  Allergies  Allergen Reactions   Codeine Other (See Comments)    Other Reaction: Not Assessed     ROS  As noted in HPI.   Physical Exam  BP 135/88 (BP Location: Right Arm)   Pulse 98   Temp 98.3 F (36.8 C) (Oral)   Ht 5\' 8"  (1.727 m)   Wt 72.6 kg   SpO2 97%   BMI 24.33 kg/m   Constitutional: Well developed, well nourished, no acute distress.  Normal voice. Eyes:  EOMI, conjunctiva normal bilaterally HENT: Normocephalic, atraumatic,mucus membranes moist.  Mild nasal congestion.  Positive erythematous oropharynx, enlarged tonsils.  No exudates.  Questionable nontender ulcers on the right side soft palate  Uvula midline.  Respiratory: Normal inspiratory  effort Cardiovascular: Normal rate, no murmurs, rubs, gallops GI: nondistended, nontender. No appreciable splenomegaly skin: No rash, skin intact Lymph: Positive anterior cervical LN.  No posterior cervical lymphadenopathy Musculoskeletal: no deformities Neurologic: Alert & oriented x 3, no focal neuro deficits Psychiatric: Speech and behavior appropriate.   ED Course   Medications - No data to display  Orders Placed This Encounter  Procedures   Group A Strep by PCR    Standing Status:   Standing    Number of Occurrences:   1   Mononucleosis screen    Standing Status:   Standing    Number of Occurrences:   1    Results for orders placed or performed during the hospital encounter of 11/18/21 (from the past 24 hour(s))  Group A Strep by PCR     Status: None   Collection Time: 11/18/21  2:10 PM   Specimen: Throat; Sterile Swab  Result Value Ref Range   Group A Strep by PCR NOT DETECTED NOT DETECTED  Mononucleosis screen     Status: None   Collection Time: 11/18/21  2:53 PM  Result Value Ref Range   Mono Screen NEGATIVE NEGATIVE   No results found.  ED Clinical Impression  1. Pharyngitis, unspecified etiology      ED Assessment/Plan    Strep PCR negative.  Patient declined influenza, COVID testing.  will check a mono given duration of symptoms, but I suspect a viral pharyngitis.  Patient home with ibuprofen, Tylenol, Benadryl/Maalox mixture.. Patient to followup with PCP when necessary.   Mono negative.  Plan as above.  Discussed labs,  MDM, plan and followup with patient. Discussed sn/sx that should prompt return to the ED. patient agrees with plan.   No orders of the defined types were placed in this encounter.    *This clinic note was created using Dragon dictation software. Therefore, there may be occasional mistakes despite careful proofreading.     13/03/23, MD 11/18/21 1529

## 2021-12-15 ENCOUNTER — Other Ambulatory Visit: Payer: Self-pay | Admitting: Obstetrics and Gynecology

## 2021-12-15 DIAGNOSIS — Z1231 Encounter for screening mammogram for malignant neoplasm of breast: Secondary | ICD-10-CM

## 2022-05-01 MED ORDER — CEFAZOLIN SODIUM-DEXTROSE 2-4 GM/100ML-% IV SOLN
2.0000 g | INTRAVENOUS | Status: AC
  Administered 2022-05-02: 2 g via INTRAVENOUS

## 2022-05-01 MED ORDER — SODIUM CHLORIDE 0.9 % IV SOLN
80.0000 mg | INTRAVENOUS | Status: DC
  Filled 2022-05-01: qty 2

## 2022-05-01 MED ORDER — SODIUM CHLORIDE 0.9 % IV SOLN
80.0000 mg | INTRAVENOUS | Status: AC
  Administered 2022-05-02: 80 mg
  Filled 2022-05-01: qty 2

## 2022-05-02 ENCOUNTER — Encounter: Payer: Self-pay | Admitting: Cardiology

## 2022-05-02 ENCOUNTER — Encounter
Admission: RE | Disposition: A | Discharge status: Home / Self Care | Payer: Self-pay | Source: Home / Self Care | Attending: Cardiology

## 2022-05-02 ENCOUNTER — Other Ambulatory Visit: Payer: Self-pay

## 2022-05-02 ENCOUNTER — Observation Stay: Payer: BC Managed Care – PPO

## 2022-05-02 ENCOUNTER — Observation Stay
Admission: RE | Admit: 2022-05-02 | Discharge: 2022-05-02 | Disposition: A | Discharge status: Home / Self Care | Payer: BC Managed Care – PPO | Attending: Cardiology | Admitting: Cardiology

## 2022-05-02 DIAGNOSIS — Z7952 Long term (current) use of systemic steroids: Secondary | ICD-10-CM | POA: Diagnosis not present

## 2022-05-02 DIAGNOSIS — F17210 Nicotine dependence, cigarettes, uncomplicated: Secondary | ICD-10-CM | POA: Insufficient documentation

## 2022-05-02 DIAGNOSIS — I10 Essential (primary) hypertension: Secondary | ICD-10-CM | POA: Insufficient documentation

## 2022-05-02 DIAGNOSIS — Z79899 Other long term (current) drug therapy: Secondary | ICD-10-CM | POA: Diagnosis not present

## 2022-05-02 DIAGNOSIS — I442 Atrioventricular block, complete: Secondary | ICD-10-CM | POA: Diagnosis present

## 2022-05-02 DIAGNOSIS — I495 Sick sinus syndrome: Secondary | ICD-10-CM

## 2022-05-02 HISTORY — PX: PACEMAKER IMPLANT: EP1218

## 2022-05-02 HISTORY — DX: Essential (primary) hypertension: I10

## 2022-05-02 HISTORY — DX: Sick sinus syndrome: I49.5

## 2022-05-02 SURGERY — PACEMAKER IMPLANT
Anesthesia: Moderate Sedation

## 2022-05-02 MED ORDER — ONDANSETRON HCL 4 MG/2ML IJ SOLN: 4.0000 mg | Freq: Four times a day (QID) | INTRAMUSCULAR | Status: DC | PRN

## 2022-05-02 MED ORDER — TRAMADOL HCL 50 MG PO TABS
50.0000 mg | ORAL_TABLET | Freq: Four times a day (QID) | ORAL | Status: DC | PRN
  Administered 2022-05-02: 50 mg via ORAL

## 2022-05-02 MED ORDER — CEFAZOLIN SODIUM-DEXTROSE 1-4 GM/50ML-% IV SOLN: 1.0000 g | Freq: Four times a day (QID) | INTRAVENOUS | Status: DC

## 2022-05-02 MED ORDER — HEPARIN (PORCINE) IN NACL 1000-0.9 UT/500ML-% IV SOLN
INTRAVENOUS | Status: DC | PRN
  Administered 2022-05-02: 500 mL

## 2022-05-02 MED ORDER — MIDAZOLAM HCL 2 MG/2ML IJ SOLN
INTRAMUSCULAR | Status: DC | PRN
  Administered 2022-05-02: .5 mg via INTRAVENOUS
  Administered 2022-05-02 (×2): 1 mg via INTRAVENOUS

## 2022-05-02 MED ORDER — FENTANYL CITRATE (PF) 100 MCG/2ML IJ SOLN
INTRAMUSCULAR | Status: AC
  Filled 2022-05-02: qty 2

## 2022-05-02 MED ORDER — TRAMADOL HCL 50 MG PO TABS
ORAL_TABLET | ORAL | Status: AC
  Filled 2022-05-02: qty 1

## 2022-05-02 MED ORDER — LIDOCAINE HCL 1 % IJ SOLN
INTRAMUSCULAR | Status: AC
  Filled 2022-05-02: qty 20

## 2022-05-02 MED ORDER — MIDAZOLAM HCL 2 MG/2ML IJ SOLN
INTRAMUSCULAR | Status: AC
  Filled 2022-05-02: qty 2

## 2022-05-02 MED ORDER — CHLORHEXIDINE GLUCONATE CLOTH 2 % EX PADS: 6.0000 | MEDICATED_PAD | Freq: Every day | CUTANEOUS | Status: DC

## 2022-05-02 MED ORDER — BUSPIRONE HCL 10 MG PO TABS: 5.0000 mg | ORAL_TABLET | Freq: Two times a day (BID) | ORAL | Status: DC

## 2022-05-02 MED ORDER — OXYCODONE HCL 5 MG PO TABS
ORAL_TABLET | ORAL | Status: AC
  Filled 2022-05-02: qty 1

## 2022-05-02 MED ORDER — FENTANYL CITRATE (PF) 100 MCG/2ML IJ SOLN
INTRAMUSCULAR | Status: DC | PRN
  Administered 2022-05-02: 12.5 ug via INTRAVENOUS
  Administered 2022-05-02 (×2): 25 ug via INTRAVENOUS

## 2022-05-02 MED ORDER — CEPHALEXIN 500 MG PO CAPS: 500.0000 mg | ORAL_CAPSULE | Freq: Two times a day (BID) | ORAL | 0 refills | Status: DC

## 2022-05-02 MED ORDER — HYDROCHLOROTHIAZIDE 25 MG PO TABS: 25.0000 mg | ORAL_TABLET | Freq: Every day | ORAL | Status: DC

## 2022-05-02 MED ORDER — LEVOTHYROXINE SODIUM 88 MCG PO TABS: 88.0000 ug | ORAL_TABLET | Freq: Every day | ORAL | Status: DC

## 2022-05-02 MED ORDER — ALPRAZOLAM 0.5 MG PO TABS: 0.5000 mg | ORAL_TABLET | Freq: Two times a day (BID) | ORAL | Status: DC | PRN

## 2022-05-02 MED ORDER — ACETAMINOPHEN 325 MG PO TABS: 325.0000 mg | ORAL_TABLET | ORAL | Status: DC | PRN

## 2022-05-02 MED ORDER — PAROXETINE HCL 30 MG PO TABS: 30.0000 mg | ORAL_TABLET | Freq: Every day | ORAL | Status: DC

## 2022-05-02 MED ORDER — OXYCODONE HCL 5 MG PO TABS
5.0000 mg | ORAL_TABLET | ORAL | Status: DC | PRN
  Administered 2022-05-02: 5 mg via ORAL

## 2022-05-02 MED ORDER — GABAPENTIN 100 MG PO CAPS: 100.0000 mg | ORAL_CAPSULE | Freq: Every day | ORAL | Status: DC

## 2022-05-02 MED ORDER — FLUTICASONE PROPIONATE 50 MCG/ACT NA SUSP: 2.0000 | Freq: Every day | NASAL | Status: DC

## 2022-05-02 MED ORDER — CEFAZOLIN SODIUM-DEXTROSE 2-4 GM/100ML-% IV SOLN
INTRAVENOUS | Status: AC
  Filled 2022-05-02: qty 100

## 2022-05-02 MED ORDER — LIDOCAINE HCL (PF) 1 % IJ SOLN
INTRAMUSCULAR | Status: DC | PRN
  Administered 2022-05-02: 30 mL via SUBCUTANEOUS

## 2022-05-02 MED ORDER — SODIUM CHLORIDE 0.9 % IV SOLN: INTRAVENOUS | Status: DC

## 2022-05-02 SURGICAL SUPPLY — 23 items
CABLE SURG 12 DISP A/V CHANNEL (MISCELLANEOUS) IMPLANT
DEVICE DSSCT PLSMBLD 3.0S LGHT (MISCELLANEOUS) IMPLANT
DRAPE INCISE 23X17 STRL (DRAPES) IMPLANT
DRAPE INCISE IOBAN 23X17 STRL (DRAPES) ×1 IMPLANT
ELECT REM PT RETURN 9FT ADLT (ELECTROSURGICAL) ×1
ELECTRODE REM PT RTRN 9FT ADLT (ELECTROSURGICAL) IMPLANT
INTRO PACEMAKR LEAD 9FR 13CM (INTRODUCER) ×1
INTRO PACEMKR SHEATH II 7FR (MISCELLANEOUS) ×1
INTRODUCER PACEMKR LD 9FR 13CM (INTRODUCER) IMPLANT
INTRODUCER PACEMKR SHTH II 7FR (MISCELLANEOUS) IMPLANT
IPG PACE AZUR XT DR MRI W1DR01 (Pacemaker) IMPLANT
LEAD CAPSURE NOVUS 5076-52CM (Lead) IMPLANT
LEAD CAPSURE NOVUS 5076-58CM (Lead) IMPLANT
PACE AZURE XT DR MRI W1DR01 (Pacemaker) ×1 IMPLANT
PAD ELECT DEFIB RADIOL ZOLL (MISCELLANEOUS) IMPLANT
PLASMABLADE 3.0S W/LIGHT (MISCELLANEOUS) ×1
SLING ARM IMMOBILIZER MED (SOFTGOODS) IMPLANT
SUT SILK 0 FSL (SUTURE) IMPLANT
SUT VIC AB 2-0 CT2 27 (SUTURE) IMPLANT
SUT VICRYL 4-0  27 PS-2 BARIAT (SUTURE) ×1
SUT VICRYL 4-0 27 PS-2 BARIAT (SUTURE) ×1
SUTURE VICRYL 4-0 27 PS-2 BART (SUTURE) IMPLANT
TRAY PACEMAKER INSERTION (PACKS) ×1 IMPLANT

## 2022-05-02 NOTE — Discharge Summary (Signed)
Discharge Summary      Patient ID: Connie Oneal MRN: [id number] DOB/AGE: 1975-03-15 47 y.o.  Admit date: 05/02/2022 Discharge date: 05/02/2022  Primary Discharge Diagnosis complete heart block Secondary Discharge Diagnosis s/p pacemaker   Significant Diagnostic Studies:   Medtronic Dual Chamber Pacemaker Implantation 05/02/2022   The left chest was prepped and draped in usual sterile manner. Anesthesia was obtained 1% lidocaine locally. 6 cm incision was performed over the left pectoral region. The pacemaker pocket was generated electrocautery and blunt dissection. Access was obtained to the left subclavian vein by fine-needle aspiration. MRI compatible leads were positioned in the right ventricular apical septum (5076 CapSurefix [id number] ) and right atrial appendage (Medtronic 5076 CapSurefix [id number]) under fluoroscopic guidance. After proper thresholds were obtained the leads were sutured in place with 0 silk. The leads were connected to a MRI compatible dual-chamber rate responsive pacemaker generator (Medtronic Azure XT DR MRI [id number]). The pacemaker pocket was irrigated with gentamicin solution. The new pacemaker generator was positioned into the pocket and the pocket was closed with 2-0 and 4-0 Vicryl, respectively. Steri-Strips and Tegaderm were applied. There were no periprocedural complications. Patient received 2.5 mg of Versed and 2.5 mcg of fentanyl. Postprocedural interrogation revealed atrial lead impedance 570 ohms, P wave 2.6 mV, threshold 0.5 V at 0.40 ms pulse width. Ventricular lead impedance was 798 ohms, R wave 6.0 mV, threshold 0.75 V at 0.40 ms pulse width. Estimated blood loss <50 mL.   During this procedure medications were administered to achieve and maintain moderate conscious sedation while the patient's heart rate, blood pressure, and oxygen saturation were continuously monitored and I was present face-to-face 100% of this time.   Consults:  none  Hospital Course: The patient was brought to the cardiac cath lab and underwent Medtronic dual-chamber pacemaker placement with Dr. Marcina Millard on 05/02/2022. The patient tolerated with procedure well without complications. The device was interrogated post procedure and is functioning appropriately with good threshold and lead impedance.  Postprocedural chest x-ray was without pneumothorax. Hospital course was uneventful without periprocedural complications. Later the same day, the patient was feeling well, ambulatory without assistance, and eager to go home.  The patient was given aftercare instructions and ER precautions. Rx for cephalexin  BID x 10 days sent to pharmacy for skin infection prophylaxis. She will follow up with Marijo Conception, NP at [facility] on Friday, 4/26 at 12pm, and knows to call with questions or concerns in the interim.    Discharge Exam: Blood pressure 123/85, pulse (!) 53, temperature 98.1 F (36.7 C), temperature source Oral, resp. rate 11, height  (1.727 m), weight 70.3 kg, SpO2 96 %.    PHYSICAL EXAM General: Pleasant middle aged caucasian female, well nourished, in no acute distress. Sitting upright in hospital bed, husband at bedside. HEENT:  Normocephalic and atraumatic. Neck:   No JVD.  Chest: Left chest with gauze and dry tegaderm in place with ecchymosis to lateral edge of bandage without bleeding. Lungs: Normal respiratory effort on room air.  Clear to ascultation bilaterally. Heart: HRRR . Normal S1 and S2 without gallops or murmurs.  Abdomen: non-distended appearing.  Msk: Normal strength and tone for age. Extremities: No clubbing, cyanosis, edema. Left arm in sling.  Neuro: Alert and oriented x3 Psych:  mood appropriate for situation.    Labs:   Lab Results  Component Value Date   WBC 11.8 (H) 01/17/2017   HGB 14.4 01/17/2017   HCT 42.1 01/17/2017   MCV 88.9  01/17/2017   PLT 315 01/17/2017     Radiology:  PORTABLE  CHEST 1 VIEW   COMPARISON:  Chest x-ray dated January 17, 2017   FINDINGS: Cardiac and mediastinal contours are within normal limits. Left chest wall dual lead pacer with leads overlying the expected area of the right atrium and right ventricle. Right costophrenic angle is excluded from the field of view. Lungs are clear. No evidence of pleural effusion or pneumothorax.   IMPRESSION: Left chest wall dual lead pacer with leads overlying the expected area of the right atrium and right ventricle.     Electronically Signed   By: Allegra Lai M.D.   On: 05/02/2022 14:44  EKG: NSR 60 BPM   FOLLOW UP PLANS AND APPOINTMENTS  Allergies as of 05/02/2022       Reactions   Codeine Nausea And Vomiting   Patient states she took on an empty stomach        Medication List     STOP taking these medications    AeroChamber MV inhaler   albuterol 108 (90 Base) MCG/ACT inhaler Commonly known as: VENTOLIN HFA   benzonatate 100 MG capsule Commonly known as: TESSALON   ipratropium 0.06 % nasal spray Commonly known as: ATROVENT   promethazine-dextromethorphan 6.25-15 MG/5ML syrup Commonly known as: PROMETHAZINE-DM       TAKE these medications    ALPRAZolam 0.5 MG tablet Commonly known as: XANAX Take 0.5 mg by mouth 2 (two) times daily as needed.   busPIRone 5 MG tablet Commonly known as: BUSPAR Take 5 mg by mouth daily.   cephALEXin 500 MG capsule Commonly known as: Keflex Take 1 capsule (500 mg total) by mouth 2 (two) times daily. Start taking on: May 03, 2022   fexofenadine 180 MG tablet Commonly known as: ALLEGRA Take 180 mg by mouth daily.   fluticasone 50 MCG/ACT nasal spray Commonly known as: FLONASE Place 2 sprays into both nostrils daily.   hydrochlorothiazide 25 MG tablet Commonly known as: HYDRODIURIL Take 25 mg by mouth daily.   levonorgestrel 20 MCG/DAY Iud Commonly known as: MIRENA 1 each by Intrauterine route once.   levothyroxine 25 MCG  tablet Commonly known as: SYNTHROID Take 25 mcg by mouth daily before breakfast.   PARoxetine 20 MG tablet Commonly known as: PAXIL Take 20 mg by mouth daily.        Follow-up Information     [facility] Go in 10 day(s).   Why: appointment with Marijo Conception, NP on Friday, 4/26 at 12pm Contact information: [address] [place] [address] [phone]                 PLEASE BRING ALL MEDICATIONS WITH YOU TO FOLLOW UP APPOINTMENTS  This patient's plan of care was discussed and created with Dr. Darrold Junker and he is in agreement.    Time spent with patient: >75mins Signed:  Rebeca Allegra PA-C 05/02/2022, 3:30 PM [facility]

## 2022-05-02 NOTE — Discharge Instructions (Signed)
For 6 weeks, avoid lifting greater than 15 pounds or raising your left arm above your head. Please do not shower until Thursday 4/18 and do not submerge your left chest in water (no baths, swimming) for at least 1 week or until you follow up in office with Marijo Conception, NP. You can remove the clear bandage on your left chest if it starts to peel off, but please do NOT take off the steri strips underneath (thin rectangular strips). Take all your medicines as prescribed, including the antibiotic I have prescribed called Keflex (cefalexin) two times per day for 10 days. Please call Dr. Darrold Junker / Geryl Councilman office at [facility] [phone]) or if you have any questions or concerns.

## 2022-05-03 ENCOUNTER — Encounter: Payer: Self-pay | Admitting: Cardiology

## 2023-04-16 ENCOUNTER — Ambulatory Visit
Admission: RE | Admit: 2023-04-16 | Discharge: 2023-04-16 | Disposition: A | Discharge status: Home / Self Care | Source: Ambulatory Visit | Attending: Emergency Medicine | Admitting: Emergency Medicine

## 2023-04-16 VITALS — BP 116/74 | HR 66 | Temp 98.2°F | Resp 16

## 2023-04-16 DIAGNOSIS — L247 Irritant contact dermatitis due to plants, except food: Secondary | ICD-10-CM

## 2023-04-16 MED ORDER — PREDNISONE 10 MG (21) PO TBPK: ORAL_TABLET | Freq: Every day | ORAL | 0 refills | Status: DC

## 2023-04-16 MED ORDER — DEXAMETHASONE SODIUM PHOSPHATE 10 MG/ML IJ SOLN
10.0000 mg | Freq: Once | INTRAMUSCULAR | Status: AC
  Administered 2023-04-16: 10 mg via INTRAMUSCULAR

## 2023-04-16 NOTE — ED Triage Notes (Signed)
 I've had it over a week and it's spreading everywhere.

## 2023-04-16 NOTE — Discharge Instructions (Signed)
Take the prednisone according to the package instructions.  Use over-the-counter Allegra, Claritin, or Zyrtec during the day as needed for itching and use Benadryl 50 mg at bedtime.  This may also help you sleep as a steroids may interrupt your sleep cycle.  Apply calamine lotion to the rash on your extremities to help dry it up.  Do not use calamine lotion on your face.  For facial lesions, if you develop any changes in your vision or itching and irritation in your eyes please go to the ER for evaluation or follow-up with ophthalmology.  

## 2023-04-16 NOTE — ED Provider Notes (Signed)
 [facility]    CSN: [id number] Arrival date & time: 04/16/23  1157      History   Chief Complaint Chief Complaint  Patient presents with   Poison Ivy    I've had it over a week and it's spreading everywhere. - Entered by patient    HPI Connie Oneal is a 48 y.o. female.   HPI  48 year old female with past medical history significant for sick sinus syndrome with complete heart block and pacemaker implant, migraines, hypertension, depression, anxiety, and allergies presents for evaluation of poison ivy rash.  She reports that it started on her left upper arm and now she has it on both hands, both eyes, and on her abdomen.  She thinks that the exposure came from weeding in her garden without gloves.  She also has animals and she could have picked it up from them.  She denies any facial rash or difficulty breathing.  Past Medical History:  Diagnosis Date   Allergy    Anxiety    Depression    Hypertension    Migraines    Sick sinus syndrome (HCC)    Viral meningitis 2004    Patient Active Problem List   Diagnosis Date Noted   Complete heart block (HCC) 05/02/2022   Hypothyroidism 02/07/2017   Hoarseness 02/07/2017   Leukocytosis 01/17/2017    Past Surgical History:  Procedure Laterality Date   PACEMAKER IMPLANT N/A 05/02/2022   Procedure: PACEMAKER IMPLANT;  Surgeon: Marcina Millard, MD;  Location: [facility] INVASIVE CV LAB;  Service: Cardiovascular;  Laterality: N/A;    OB History   No obstetric history on file.      Home Medications    Prior to Admission medications   Medication Sig Start Date End Date Taking? Authorizing Provider  busPIRone (BUSPAR) 5 MG tablet Take 5 mg by mouth daily. 08/25/21  Yes [provider]  fexofenadine (ALLEGRA) 180 MG tablet Take 180 mg by mouth daily. 12/27/16  Yes [provider]  fluticasone (FLONASE) 50 MCG/ACT nasal spray Place 2 sprays into both nostrils daily. 08/23/21  Yes [provider]  hydrochlorothiazide (HYDRODIURIL) 25 MG tablet Take 25 mg by mouth daily. 12/27/16  Yes [provider]  levonorgestrel (MIRENA) 20 MCG/DAY IUD 1 each by Intrauterine route once.   Yes [provider]  levothyroxine (SYNTHROID, LEVOTHROID) 25 MCG tablet Take 25 mcg by mouth daily before breakfast.   Yes [provider]  PARoxetine (PAXIL) 20 MG tablet Take 20 mg by mouth daily. 11/02/16  Yes [provider]  predniSONE (STERAPRED UNI-PAK 21 TAB) 10 MG (21) TBPK tablet Take by mouth daily. Take 6 tabs by mouth daily  for 2 days, then 5 tabs for 2 days, then 4 tabs for 2 days, then 3 tabs for 2 days, 2 tabs for 2 days, then 1 tab by mouth daily for 2 days 04/16/23  Yes Becky Augusta, NP  ALPRAZolam Prudy Feeler) 0.5 MG tablet Take 0.5 mg by mouth 2 (two) times daily as needed. 05/29/21   [provider]  cephALEXin (KEFLEX) 500 MG capsule Take 1 capsule (500 mg total) by mouth 2 (two) times daily. 05/03/22   Rebeca Allegra, PA-C    Family History Family History  Problem Relation Age of Onset   Breast cancer Paternal Aunt 71   Breast cancer Maternal Aunt        mat great aunt    Social History Social History   Tobacco Use   Smoking status:  Every Day    Current packs/day: 0.50    Average packs/day: 0.5 packs/day for 25.0 years (12.5 ttl pk-yrs)    Types: Cigarettes   Smokeless tobacco: Never  Vaping Use   Vaping status: Never Used  Substance Use Topics   Alcohol use: Yes    Comment: Occasional   Drug use: Yes    Types: Marijuana    Comment: last use yesterday     Allergies   Codeine   Review of Systems Review of Systems  HENT:  Negative for facial swelling.   Respiratory:  Negative for shortness of breath.   Skin:  Positive for rash.     Physical Exam Triage Vital Signs ED Triage Vitals  Encounter Vitals Group     BP 04/16/23 1212 116/74     Systolic BP Percentile --      Diastolic BP Percentile --      Pulse Rate  04/16/23 1212 66     Resp 04/16/23 1212 16     Temp 04/16/23 1212 98.2 F (36.8 C)     Temp Source 04/16/23 1212 Oral     SpO2 04/16/23 1212 95 %     Weight --      Height --      Head Circumference --      Peak Flow --      Pain Score 04/16/23 1211 0     Pain Loc --      Pain Education --      Exclude from Growth Chart --    No data found.  Updated Vital Signs BP 116/74 (BP Location: Right Arm)   Pulse 66   Temp 98.2 F (36.8 C) (Oral)   Resp 16   SpO2 95%   Visual Acuity Right Eye Distance:   Left Eye Distance:   Bilateral Distance:    Right Eye Near:   Left Eye Near:    Bilateral Near:     Physical Exam Vitals and nursing note reviewed.  Constitutional:      Appearance: Normal appearance.  Skin:    General: Skin is warm and dry.     Capillary Refill: Capillary refill takes less than 2 seconds.     Findings: Rash present.  Neurological:     General: No focal deficit present.     Mental Status: She is alert and oriented to person, place, and time.      UC Treatments / Results  Labs (all labs ordered are listed, but only abnormal results are displayed) Labs Reviewed - No data to display  EKG   Radiology No results found.  Procedures Procedures (including critical care time)  Medications Ordered in UC Medications  dexamethasone (DECADRON) injection 10 mg (has no administration in time range)    Initial Impression / Assessment and Plan / UC Course  I have reviewed the triage vital signs and the nursing notes.  Pertinent labs & imaging results that were available during my care of the patient were reviewed by me and considered in my medical decision making (see chart for details).   Patient is a pleasant, nontoxic-appearing 48 year old female presenting for evaluation of Contac dermatitis secondary to poison ivy exposure.      As you see in images above, the rash consists of scattered erythematous, maculopapular lesions with some vesicle  formation but no pustules.  She has no facial rash and she is not having any swelling of her lips or tongue, tightness in her throat, or shortness of breath.  I will treat her for contact dermatitis secondary to poison ivy exposure with a 12-day course of prednisone.  I will have staff administer 10 mg of IM Decadron here in clinic and she can start prednisone tomorrow morning.  She can also use over-the-counter calamine to help dry up the lesions.  If she develops any rash on her face, especially around her eyes, changes in vision, swelling to her lips or tongue, or tightness in throat she is to go to the ER for evaluation.  Return precautions reviewed.  Work note provided.   Final Clinical Impressions(s) / UC Diagnoses   Final diagnoses:  Irritant contact dermatitis due to plants, except food     Discharge Instructions      Take the prednisone according to the package instructions.  Use over-the-counter Allegra, Claritin, or Zyrtec during the day as needed for itching and use Benadryl 50 mg at bedtime.  This may also help you sleep as a steroids may interrupt your sleep cycle.  Apply calamine lotion to the rash on your extremities to help dry it up.  Do not use calamine lotion on your face.  For facial lesions, if you develop any changes in your vision or itching and irritation in your eyes please go to the ER for evaluation or follow-up with ophthalmology.      ED Prescriptions     Medication Sig Dispense Auth. Provider   predniSONE (STERAPRED UNI-PAK 21 TAB) 10 MG (21) TBPK tablet Take by mouth daily. Take 6 tabs by mouth daily  for 2 days, then 5 tabs for 2 days, then 4 tabs for 2 days, then 3 tabs for 2 days, 2 tabs for 2 days, then 1 tab by mouth daily for 2 days 42 tablet Becky Augusta, NP      PDMP not reviewed this encounter.   Becky Augusta, NP 04/16/23 [phone]

## 2023-04-18 ENCOUNTER — Other Ambulatory Visit: Payer: Self-pay | Admitting: Physician Assistant

## 2023-04-18 DIAGNOSIS — Z1231 Encounter for screening mammogram for malignant neoplasm of breast: Secondary | ICD-10-CM

## 2023-11-06 ENCOUNTER — Ambulatory Visit
Admission: EM | Admit: 2023-11-06 | Discharge: 2023-11-06 | Disposition: A | Discharge status: Home / Self Care | Attending: Emergency Medicine | Admitting: Emergency Medicine

## 2023-11-06 DIAGNOSIS — H60501 Unspecified acute noninfective otitis externa, right ear: Secondary | ICD-10-CM | POA: Diagnosis not present

## 2023-11-06 MED ORDER — NEOMYCIN-POLYMYXIN-HC 3.5-10000-1 OT SUSP: 4.0000 [drp] | Freq: Four times a day (QID) | OTIC | 0 refills | Status: AC

## 2023-11-06 MED ORDER — IBUPROFEN 600 MG PO TABS: 600.0000 mg | ORAL_TABLET | Freq: Four times a day (QID) | ORAL | 0 refills | Status: AC | PRN

## 2023-11-06 NOTE — ED Provider Notes (Signed)
 HPI  SUBJECTIVE:  Connie Oneal is a 48 y.o. female who presents with 2 days of throbbing, constant right ear pain, decreased hearing, clear otorrhea.  She states she feels as if her ear canal feels swollen.  She uses Q-tips.  No fevers, nasal congestion, rhinorrhea, facial rash.  She reports popping and clicking of her jaw, but she has TMJ.  No recent swimming.  No antibiotics in the past month.  No antipyretic in the past 6 hours.  She tried hydrogen peroxide with improvement in her symptoms.  Symptoms worse with chewing and lying on her right side. She has a past medical history of hypertension, sick sinus syndrome status post pacemaker placement, viral meningitis, right TMJ arthralgia, frequent otitis media.  No history of MRSA.  [facility]  Past Medical History:  Diagnosis Date   Allergy    Anxiety    Depression    Hypertension    Migraines    Sick sinus syndrome (HCC)    Viral meningitis 2004    Past Surgical History:  Procedure Laterality Date   PACEMAKER IMPLANT N/A 05/02/2022   Procedure: PACEMAKER IMPLANT;  Surgeon: Ammon Blunt, MD;  Location: [facility];  Service: Cardiovascular;  Laterality: N/A;    Family History  Problem Relation Age of Onset   Breast cancer Paternal Aunt 48   Breast cancer Maternal Aunt        mat great aunt    Social History   Tobacco Use   Smoking status: Every Day    Current packs/day: 0.50    Average packs/day: 0.5 packs/day for 25.0 years (12.5 ttl pk-yrs)    Types: Cigarettes   Smokeless tobacco: Never  Vaping Use   Vaping status: Never Used  Substance Use Topics   Alcohol use: Yes    Comment: Occasional   Drug use: Yes    Types: Marijuana    Comment: last use yesterday    No current facility-administered medications for this encounter.  Current Outpatient Medications:    ibuprofen (ADVIL) 600 MG tablet, Take 1 tablet (600 mg total) by mouth every 6 (six) hours as needed., Disp: 30 tablet,  Rfl: 0   neomycin -polymyxin-hydrocortisone (CORTISPORIN) 3.5-10000-1 OTIC suspension, Place 4 drops into the right ear 4 (four) times daily for 7 days., Disp: 7.5 mL, Rfl: 0   ALPRAZolam  (XANAX ) 0.5 MG tablet, Take 0.5 mg by mouth 2 (two) times daily as needed., Disp: , Rfl:    busPIRone  (BUSPAR ) 5 MG tablet, Take 5 mg by mouth daily., Disp: , Rfl:    fexofenadine (ALLEGRA) 180 MG tablet, Take 180 mg by mouth daily., Disp: , Rfl:    fluticasone  (FLONASE ) 50 MCG/ACT nasal spray, Place 2 sprays into both nostrils daily., Disp: , Rfl:    hydrochlorothiazide  (HYDRODIURIL ) 25 MG tablet, Take 25 mg by mouth daily., Disp: , Rfl:    levonorgestrel (MIRENA) 20 MCG/DAY IUD, 1 each by Intrauterine route once., Disp: , Rfl:    levothyroxine  (SYNTHROID , LEVOTHROID) 25 MCG tablet, Take 25 mcg by mouth daily before breakfast., Disp: , Rfl:    PARoxetine  (PAXIL ) 20 MG tablet, Take 20 mg by mouth daily., Disp: , Rfl:   Allergies  Allergen Reactions   Codeine Nausea And Vomiting    Patient states she took on an empty stomach      ROS  As noted in HPI.   Physical Exam  BP 122/84 (BP Location: Left Arm)   Pulse 75   Temp 98.6 F (37  C) (Oral)   Resp 16   Ht 5' 8 (1.727 m)   Wt 71.7 kg   SpO2 98%   BMI 24.02 kg/m   Constitutional: Well developed, well nourished, no acute distress Eyes:  EOMI, conjunctiva normal bilaterally HENT: Normocephalic, atraumatic,mucus membranes moist Right ear: Right external ear normal.  Erythematous, slightly swollen EAC.  TM intact, normal.  Pain with traction on pinna, palpation of tragus.  No pain with palpation of mastoid.  No appreciable otorrhea.  Hearing slightly decreased compared to the left.  No tenderness, crepitus at the TMJ.  No erythema, swelling anterior to the ear. Left external ear, EAC, TM normal Neck: No cervical lymphadenopathy Respiratory: Normal inspiratory effort Cardiovascular: Normal rate GI: nondistended skin: No facial rash, skin  intact Musculoskeletal: no deformities Neurologic: Alert & oriented x 3, no focal neuro deficits Psychiatric: Speech and behavior appropriate   ED Course   Medications - No data to display  No orders of the defined types were placed in this encounter.   No results found for this or any previous visit (from the past 24 hours). No results found.  ED Clinical Impression  1. Acute otitis externa of right ear, unspecified type      ED Assessment/Plan    Presentation consistent with an otitis externa.  I considered cellulitis of the external ear canal, but will try treating this as an otitis externa for now.  Home with Cortisporin, Tylenol /ibuprofen.  Return here or follow-up with her PCP if not better in 4 to 5 days and we can consider oral antibiotics at that time.   Discussed MDM, treatment plan, and plan for follow-up with patient. patient agrees with plan.   Meds ordered this encounter  Medications   neomycin -polymyxin-hydrocortisone (CORTISPORIN) 3.5-10000-1 OTIC suspension    Sig: Place 4 drops into the right ear 4 (four) times daily for 7 days.    Dispense:  7.5 mL    Refill:  0   ibuprofen (ADVIL) 600 MG tablet    Sig: Take 1 tablet (600 mg total) by mouth every 6 (six) hours as needed.    Dispense:  30 tablet    Refill:  0      *This clinic note was created using Scientist, clinical (histocompatibility and immunogenetics). Therefore, there may be occasional mistakes despite careful proofreading.  ?    Van Knee, MD 11/06/23 1424

## 2023-11-06 NOTE — Discharge Instructions (Signed)
 We are going to try treating this is an otitis externa for now with Cortisporin eardrops and Tylenol  combined with ibuprofen.  Take 1000 mg of Tylenol  with 600 mg of ibuprofen 3-4 times a day as needed for pain.  Warm compresses.

## 2023-11-06 NOTE — ED Triage Notes (Signed)
 Pt c/o R ear pain & drainage x2 days. Has tried peroxide w/o relief.

## 2023-11-07 ENCOUNTER — Ambulatory Visit

## 2023-12-04 ENCOUNTER — Ambulatory Visit
Admission: RE | Admit: 2023-12-04 | Discharge: 2023-12-04 | Disposition: A | Discharge status: Home / Self Care | Source: Ambulatory Visit | Attending: Physician Assistant | Admitting: Physician Assistant

## 2023-12-04 DIAGNOSIS — Z1231 Encounter for screening mammogram for malignant neoplasm of breast: Secondary | ICD-10-CM | POA: Insufficient documentation
